# Patient Record
Sex: Female | Born: 1975 | Race: White | Hispanic: No | Marital: Single | Smoking: Never smoker
Health system: Southern US, Community
[De-identification: ages and names within clinical notes are randomized; demographics above are authoritative.]

## PROBLEM LIST (undated history)

## (undated) ENCOUNTER — Inpatient Hospital Stay (HOSPITAL_COMMUNITY): Payer: Self-pay

## (undated) DIAGNOSIS — J40 Bronchitis, not specified as acute or chronic: Secondary | ICD-10-CM

## (undated) DIAGNOSIS — O09299 Supervision of pregnancy with other poor reproductive or obstetric history, unspecified trimester: Secondary | ICD-10-CM

## (undated) DIAGNOSIS — Z3483 Encounter for supervision of other normal pregnancy, third trimester: Principal | ICD-10-CM

## (undated) DIAGNOSIS — O09529 Supervision of elderly multigravida, unspecified trimester: Secondary | ICD-10-CM

## (undated) DIAGNOSIS — K219 Gastro-esophageal reflux disease without esophagitis: Secondary | ICD-10-CM

## (undated) DIAGNOSIS — R51 Headache: Secondary | ICD-10-CM

## (undated) HISTORY — DX: Headache: R51

## (undated) HISTORY — DX: Gastro-esophageal reflux disease without esophagitis: K21.9

## (undated) HISTORY — DX: Supervision of elderly multigravida, unspecified trimester: O09.529

## (undated) HISTORY — DX: Supervision of pregnancy with other poor reproductive or obstetric history, unspecified trimester: O09.299

---

## 1997-10-26 ENCOUNTER — Emergency Department (HOSPITAL_COMMUNITY): Admission: EM | Admit: 1997-10-26 | Discharge: 1997-10-26 | Payer: Self-pay

## 1997-10-30 ENCOUNTER — Emergency Department (HOSPITAL_COMMUNITY): Admission: EM | Admit: 1997-10-30 | Discharge: 1997-10-30 | Payer: Self-pay | Admitting: Emergency Medicine

## 2000-09-03 ENCOUNTER — Emergency Department (HOSPITAL_COMMUNITY): Admission: EM | Admit: 2000-09-03 | Discharge: 2000-09-03 | Payer: Self-pay | Admitting: Emergency Medicine

## 2000-09-03 ENCOUNTER — Encounter: Payer: Self-pay | Admitting: Emergency Medicine

## 2000-10-29 ENCOUNTER — Emergency Department (HOSPITAL_COMMUNITY): Admission: EM | Admit: 2000-10-29 | Discharge: 2000-10-29 | Payer: Self-pay | Admitting: *Deleted

## 2001-02-05 ENCOUNTER — Emergency Department (HOSPITAL_COMMUNITY): Admission: EM | Admit: 2001-02-05 | Discharge: 2001-02-05 | Payer: Self-pay | Admitting: Emergency Medicine

## 2001-03-03 ENCOUNTER — Other Ambulatory Visit: Admission: RE | Admit: 2001-03-03 | Discharge: 2001-03-03 | Payer: Self-pay | Admitting: Obstetrics and Gynecology

## 2001-04-25 ENCOUNTER — Encounter: Payer: Self-pay | Admitting: Obstetrics and Gynecology

## 2001-04-25 ENCOUNTER — Ambulatory Visit (HOSPITAL_COMMUNITY): Admission: RE | Admit: 2001-04-25 | Discharge: 2001-04-25 | Payer: Self-pay | Admitting: Obstetrics and Gynecology

## 2001-05-16 ENCOUNTER — Encounter: Payer: Self-pay | Admitting: Obstetrics and Gynecology

## 2001-05-16 ENCOUNTER — Ambulatory Visit (HOSPITAL_COMMUNITY): Admission: RE | Admit: 2001-05-16 | Discharge: 2001-05-16 | Payer: Self-pay | Admitting: Obstetrics and Gynecology

## 2001-08-26 ENCOUNTER — Inpatient Hospital Stay (HOSPITAL_COMMUNITY): Admission: AD | Admit: 2001-08-26 | Discharge: 2001-08-26 | Payer: Self-pay | Admitting: Obstetrics and Gynecology

## 2001-08-28 ENCOUNTER — Inpatient Hospital Stay (HOSPITAL_COMMUNITY): Admission: AD | Admit: 2001-08-28 | Discharge: 2001-08-30 | Payer: Self-pay | Admitting: Obstetrics and Gynecology

## 2002-03-02 HISTORY — PX: TIBIA FRACTURE SURGERY: SHX806

## 2002-05-24 ENCOUNTER — Other Ambulatory Visit: Admission: RE | Admit: 2002-05-24 | Discharge: 2002-05-24 | Payer: Self-pay | Admitting: Obstetrics and Gynecology

## 2002-06-06 ENCOUNTER — Inpatient Hospital Stay (HOSPITAL_COMMUNITY): Admission: AC | Admit: 2002-06-06 | Discharge: 2002-06-10 | Payer: Self-pay

## 2002-06-06 ENCOUNTER — Encounter: Payer: Self-pay | Admitting: Emergency Medicine

## 2002-06-07 ENCOUNTER — Encounter: Payer: Self-pay | Admitting: Orthopedic Surgery

## 2002-09-12 ENCOUNTER — Encounter: Admission: RE | Admit: 2002-09-12 | Discharge: 2002-11-16 | Payer: Self-pay | Admitting: Orthopedic Surgery

## 2003-06-13 ENCOUNTER — Other Ambulatory Visit: Admission: RE | Admit: 2003-06-13 | Discharge: 2003-06-13 | Payer: Self-pay | Admitting: Obstetrics and Gynecology

## 2003-07-03 ENCOUNTER — Encounter: Admission: RE | Admit: 2003-07-03 | Discharge: 2003-07-03 | Payer: Self-pay | Admitting: Orthopedic Surgery

## 2003-07-18 ENCOUNTER — Ambulatory Visit (HOSPITAL_COMMUNITY): Admission: RE | Admit: 2003-07-18 | Discharge: 2003-07-18 | Payer: Self-pay | Admitting: Orthopedic Surgery

## 2004-05-12 ENCOUNTER — Ambulatory Visit (HOSPITAL_COMMUNITY): Admission: RE | Admit: 2004-05-12 | Discharge: 2004-05-12 | Payer: Self-pay | Admitting: Family Medicine

## 2004-05-28 ENCOUNTER — Emergency Department (HOSPITAL_COMMUNITY): Admission: EM | Admit: 2004-05-28 | Discharge: 2004-05-29 | Payer: Self-pay | Admitting: Emergency Medicine

## 2005-07-28 ENCOUNTER — Emergency Department (HOSPITAL_COMMUNITY): Admission: EM | Admit: 2005-07-28 | Discharge: 2005-07-28 | Payer: Self-pay | Admitting: Emergency Medicine

## 2006-08-18 ENCOUNTER — Emergency Department: Payer: Self-pay | Admitting: Emergency Medicine

## 2007-02-02 ENCOUNTER — Emergency Department (HOSPITAL_COMMUNITY): Admission: EM | Admit: 2007-02-02 | Discharge: 2007-02-02 | Payer: Self-pay | Admitting: Emergency Medicine

## 2007-02-03 ENCOUNTER — Emergency Department (HOSPITAL_COMMUNITY): Admission: EM | Admit: 2007-02-03 | Discharge: 2007-02-04 | Payer: Self-pay | Admitting: Emergency Medicine

## 2007-02-06 ENCOUNTER — Emergency Department (HOSPITAL_COMMUNITY): Admission: EM | Admit: 2007-02-06 | Discharge: 2007-02-07 | Payer: Self-pay | Admitting: Emergency Medicine

## 2007-06-18 ENCOUNTER — Emergency Department (HOSPITAL_COMMUNITY): Admission: EM | Admit: 2007-06-18 | Discharge: 2007-06-18 | Payer: Self-pay | Admitting: Emergency Medicine

## 2007-09-24 ENCOUNTER — Emergency Department (HOSPITAL_COMMUNITY): Admission: EM | Admit: 2007-09-24 | Discharge: 2007-09-24 | Payer: Self-pay | Admitting: Emergency Medicine

## 2010-07-18 NOTE — Op Note (Signed)
NAME:  Tracy Rojas, Tracy Rojas NO.:  [id number]   MEDICAL RECORD NO.:  [id number]                   PATIENT TYPE:  OIB   LOCATION:  NA                                   FACILITY:  [facility]   PHYSICIAN:  Myrtie Neither, M.D.                 DATE OF BIRTH:  November 11, 1975   DATE OF PROCEDURE:  07/18/2003  DATE OF DISCHARGE:                                 OPERATIVE REPORT   PREOPERATIVE DIAGNOSES:  1. Foreign body, left knee.  2. Nonunion, left tibia.  3. Painful hardware, left distal screw.   PREOPERATIVE DIAGNOSES:  1. Foreign body, left knee.  2. Nonunion, left tibia.  3. Painful hardware, left distal screw.   PROCEDURES:  1. Removal of foreign body, left knee.  2. Removal of screw, left tibia.  3. Injection of bone graft paste at the fracture site, left tibia.   SURGEON:  Myrtie Neither, M.D.   ANESTHESIA:  General.   DESCRIPTION OF PROCEDURE:  The patient was taken to the operating room after  the adequate level of premedication, given general anesthesia , intubated.  Left lower limb was prepped with Duraprep and draped  in a sterile manner.  Tourniquet used for hemostasis.  Mini C-arm used to visualize the nonunion  site as well as the foreign body.  Initially a small incision was made over  the old previous scar at the knee, going through the skin and subcutaneous  tissue down to the capsule.  The foreign body was identified and removed  which was a fragment of thick glass.  The area was irrigated and wound  closed with 3-0 nylon.   Next, incision was made over the distal tibia over the previously placed  distal screw, going through the skin.  Sharp and blunt dissection was made  down to the head of the screw, and then this screw was removed.  Irrigation  was then done, and wound closure with 3-0 nylon was done.   Next, used the mini C-arm for identification of the nonunion site.  The bone  graft past was mixed at the table, and with the use of both 16  and 18-gauge  needles, paste was able to be placed into and around the fracture site, both  anteriorly and posteriorly, both medially and laterally.  Injection sites  were irrigated and Band-Aids placed over the injections sites.   Compression dressing was applied, Cam walker applied.  The patient tolerated  the procedure quite well and went to the recovery room in stable and  satisfactory condition.  The patient is being discharged home with the use  of  crutches, partial weightbearing on the left side.  The patient is to use  __________ 10 one q.4h. p.r.n. for pain, Aciphex, and elevation at home and  to return to the office in one week.  The patient is being discharged in  stable and satisfactory condition.  Myrtie Neither, M.D.    AC/MEDQ  D:  07/18/2003  T:  07/18/2003  Job:  [id number]

## 2010-07-18 NOTE — Discharge Summary (Signed)
NAMEMITTIE, KNITTEL                        ACCOUNT NO.:  [id number]   MEDICAL RECORD NO.:  [id number]                   PATIENT TYPE:  NP   LOCATION:  9143                                 FACILITY:  [facility]   PHYSICIAN:  Huel Cote, M.D.              DATE OF BIRTH:  [birth year]   DATE OF ADMISSION:  08/28/2001  DATE OF DISCHARGE:  08/30/2001                                 DISCHARGE SUMMARY   DISCHARGE DIAGNOSES:  1. Preterm pregnancy at 36+ weeks, delivered.  2. Precipitous labor.  3. Status post normal spontaneous vaginal delivery.   DISCHARGE MEDICATIONS:  Motrin 600 mg p.o. q.6h. p.r.n.   DISCHARGE FOLLOW-UP:  The patient is to follow up in six weeks for routine  postpartum exam at which point she is interested in persuing a [place] IUD  placement.   HISTORY OF PRESENT ILLNESS:  The patient is a 35 year old G3 P1-1-0-2 who is  admitted at 7 and four-sevenths weeks with a complaint of contractions for  approximately two hours.  The patient ruptured membranes at approximately  5:30 a.m. and arrived to maternity admissions at approximately 7:30 a.m.  Upon arrival the patient was examined and found to be an anterior lip and  completely effaced, with delivery eminent.  Prenatal care had been  complicated by positive chlamydia which was treated with Zithromax and had a  negative test of cure.  She also had gestational thrombocytopenia which  remained stable throughout the pregnancy.   PRENATAL LABORATORY DATA:  B positive, antibody negative.  RPR nonreactive.  Rubella immune.  Hepatitis B surface antigen negative.  HIV negative.  GC  negative, chlamydia positive as stated earlier.  Triple screen declined.  One-hour Glucola 124.  Group B strep positive.   PAST OBSTETRICAL HISTORY:  In 1994 she had a normal spontaneous vaginal  delivery of a 34 week infant, 5 pounds 12 ounces.  In 1996 she had a normal  spontaneous vaginal delivery of a 40 week infant, 6 pounds 2 ounces.   PAST MEDICAL HISTORY:  None.   PAST SURGICAL HISTORY:  None.   PAST GYNECOLOGICAL HISTORY:  Chlamydia as stated.   ALLERGIES:  DIMETAPP.   HOSPITAL COURSE:  On admission she was afebrile with stable vital signs.  Fetal heart rate was reassuring except for variables with contractions.  The  physician on call, Dr. Lavina Hamman, was notified of the patient's arrival  to maternity admissions at 7:38 a.m. and advised of her exam with an  anterior lip.  Despite his arrival at the hospital within 15 minutes, the  patient reached complete dilation and delivered with a nurse midwife prior  to his arrival.  She was delivered of a vigorous female infant.  There was a  nuchal cord x1 which was reduced after delivery of the head.  Apgars were 8  and 9.  Weight was 5 pounds 7 ounces.  The patient was then admitted for  routine postpartum care.  She did very well.  Her platelet count was stable  at 112,000 postpartum and on postpartum day #2 she was afebrile  and tolerating her pain medications well.  She was therefore felt stable for  discharge and was discharged with medications and follow-up as previously  stated.                                                 Huel Cote, M.D.    KR/MEDQ  D:  10/06/2001  T:  10/11/2001  Job:  [id number]
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
## 2012-01-29 ENCOUNTER — Telehealth: Payer: Self-pay | Admitting: Hematology and Oncology

## 2012-01-29 NOTE — Telephone Encounter (Signed)
LVOM for pt to return call.  °

## 2012-02-01 ENCOUNTER — Telehealth: Payer: Self-pay | Admitting: Hematology and Oncology

## 2012-02-01 NOTE — Telephone Encounter (Signed)
S/W pt in re NP appt 12/12 @ 9:30 w/Dr. Dalene Carrow.  Referring Paulene Floor, NP Dx-Low PLTs Welcome packet mailed.

## 2012-02-02 ENCOUNTER — Telehealth: Payer: Self-pay | Admitting: Hematology and Oncology

## 2012-02-02 NOTE — Telephone Encounter (Signed)
C/D 02/02/12 for appt.02/11/12

## 2012-02-11 ENCOUNTER — Other Ambulatory Visit: Payer: Self-pay | Admitting: Lab

## 2012-02-11 ENCOUNTER — Ambulatory Visit: Payer: Self-pay

## 2012-02-11 ENCOUNTER — Ambulatory Visit: Payer: Self-pay | Admitting: Hematology and Oncology

## 2012-02-17 ENCOUNTER — Encounter: Payer: Self-pay | Admitting: Internal Medicine

## 2012-02-17 ENCOUNTER — Ambulatory Visit: Payer: Medicaid Other

## 2012-02-17 ENCOUNTER — Other Ambulatory Visit (HOSPITAL_BASED_OUTPATIENT_CLINIC_OR_DEPARTMENT_OTHER): Payer: Medicaid Other | Admitting: Lab

## 2012-02-17 ENCOUNTER — Ambulatory Visit (HOSPITAL_BASED_OUTPATIENT_CLINIC_OR_DEPARTMENT_OTHER): Payer: Medicaid Other | Admitting: Internal Medicine

## 2012-02-17 VITALS — BP 105/80 | HR 84 | Temp 98.4°F | Resp 22 | Ht 62.0 in | Wt 210.1 lb

## 2012-02-17 DIAGNOSIS — D696 Thrombocytopenia, unspecified: Secondary | ICD-10-CM

## 2012-02-17 HISTORY — DX: Thrombocytopenia, unspecified: D69.6

## 2012-02-17 LAB — CBC WITH DIFFERENTIAL/PLATELET
BASO%: 0.4 % (ref 0.0–2.0)
Basophils Absolute: 0 10*3/uL (ref 0.0–0.1)
EOS%: 1.3 % (ref 0.0–7.0)
Eosinophils Absolute: 0.1 10*3/uL (ref 0.0–0.5)
HCT: 41.5 % (ref 34.8–46.6)
HGB: 14.4 g/dL (ref 11.6–15.9)
LYMPH%: 31.6 % (ref 14.0–49.7)
MCH: 29.8 pg (ref 25.1–34.0)
MCHC: 34.8 g/dL (ref 31.5–36.0)
MCV: 85.8 fL (ref 79.5–101.0)
MONO#: 0.5 10*3/uL (ref 0.1–0.9)
MONO%: 6.7 % (ref 0.0–14.0)
NEUT#: 4.8 10*3/uL (ref 1.5–6.5)
NEUT%: 60 % (ref 38.4–76.8)
Platelets: 125 10*3/uL — ABNORMAL LOW (ref 145–400)
RBC: 4.84 10*6/uL (ref 3.70–5.45)
RDW: 13.7 % (ref 11.2–14.5)
WBC: 7.9 10*3/uL (ref 3.9–10.3)
lymph#: 2.5 10*3/uL (ref 0.9–3.3)

## 2012-02-17 LAB — COMPREHENSIVE METABOLIC PANEL (CC13)
ALT: 17 U/L (ref 0–55)
AST: 15 U/L (ref 5–34)
Albumin: 3.5 g/dL (ref 3.5–5.0)
Alkaline Phosphatase: 82 U/L (ref 40–150)
BUN: 9 mg/dL (ref 7.0–26.0)
CO2: 26 mEq/L (ref 22–29)
Calcium: 9 mg/dL (ref 8.4–10.4)
Chloride: 105 mEq/L (ref 98–107)
Creatinine: 0.9 mg/dL (ref 0.6–1.1)
Glucose: 107 mg/dl — ABNORMAL HIGH (ref 70–99)
Potassium: 4.2 mEq/L (ref 3.5–5.1)
Sodium: 144 mEq/L (ref 136–145)
Total Bilirubin: 0.47 mg/dL (ref 0.20–1.20)
Total Protein: 6.7 g/dL (ref 6.4–8.3)

## 2012-02-17 LAB — LACTATE DEHYDROGENASE (CC13): LDH: 172 U/L (ref 125–245)

## 2012-02-17 LAB — TECHNOLOGIST REVIEW

## 2012-02-17 NOTE — Patient Instructions (Signed)
You have mild low platelets count. Continue on observation for now. avoid NSAIDs.

## 2012-02-17 NOTE — Progress Notes (Signed)
[facility] Telephone:([phone]   Fax:([phone]  CONSULT NOTE  REFERRING PHYSICIAN: Paulene Floor, NP.  REASON FOR CONSULTATION: 36 years old white female with low platelets.  HPI Tracy Rojas is a 36 y.o. female was no significant past medical history except for left hip ORIF. The patient was seen by her family physician at the [facility] for routine evaluation and consideration of oral contraceptive pills. Routine CBC was performed on 12/24/2011 and it showed low platelets count of 133,000. Repeat CBC on 01/26/2012 showed platelets count of 124,000. The patient was referred to me today for further evaluation and recommendation regarding her condition. The patient does not have any bleeding issues, bruises or ecchymosis. She has been on treatment with proton pump inhibitors for one month but this was discontinued recently. She takes a lot of ibuprofen and Aleve for stress headache. She denied using any other over-the-counter medications. The patient denied having any significant weight loss or night sweats. She has no chest pain, shortness breath, cough or hemoptysis.  Past medical history: Significant for ORIF of the left hip. The patient denied having any history of hypertension, diabetes mellitus, coronary artery disease or stroke.  Family history: Father had throat cancer and mother had GERD and pulmonary embolism.  Social History: The patient is single and has 3 children. She is currently unemployed. She was to work as a Financial controller. She has no history of smoking, alcohol or drug abuse.   No Known Allergies  Current Outpatient Prescriptions  Medication Sig Dispense Refill  . UNABLE TO FIND Take by mouth daily. Medication for reflux, pt unsure of name        Review of Systems  A comprehensive review of systems was negative.  Physical Exam  NWG:NFAOZ, healthy, no distress, well nourished and well developed SKIN:  skin color, texture, turgor are normal HEAD: Normocephalic, No masses, lesions, tenderness or abnormalities EYES: normal, PERRLA EARS: External ears normal OROPHARYNX:no exudate and no erythema  NECK: supple, no adenopathy LYMPH:  no palpable lymphadenopathy BREAST:not examined LUNGS: clear to auscultation  HEART: regular rate & rhythm, no murmurs and no gallops ABDOMEN:abdomen soft, non-tender, normal bowel sounds and no masses or organomegaly BACK: Back symmetric, no curvature. EXTREMITIES:no joint deformities, effusion, or inflammation, no edema, no skin discoloration, no clubbing, no cyanosis  NEURO: alert & oriented x 3 with fluent speech, no focal motor/sensory deficits  PERFORMANCE STATUS: ECOG 0  LABORATORY DATA: No results found for this basename: WBC, HGB, HCT, MCV, PLT      Chemistry   No results found for this basename: NA, K, CL, CO2, BUN, CREATININE, GLU   No results found for this basename: CALCIUM, ALKPHOS, AST, ALT, BILITOT       RADIOGRAPHIC STUDIES: No results found.  ASSESSMENT: This is a very pleasant 36 years old white female with mild thrombocytopenia most likely drug-induced secondary to treatment with NSAIDs. 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 She is currently asymptomatic with no bleeding, bruises or ecchymosis.  PLAN: I have a lengthy discussion with the patient today about her condition. I advised her to discontinue taking any over the counter NSAIDs. The patient will continue on observation for now with routine followup visit with her primary care physician. I don't see a need for any further intervention at this point. I would be happy to see the patient in her platelets count less than 50,000 or if she has any significant bleeding, bruises or  ecchymosis. The patient agreed to the current plan. All questions were answered. The patient knows to call the clinic with any problems, questions or concerns. We can certainly see the patient much sooner if  necessary.  Thank you so much for allowing me to participate in the care of [name]. I will continue to follow up the patient with you and assist in her care.  I spent 25 minutes counseling the patient face to face. The total time spent in the appointment was 5 minutes.   Tracy Rojas K. 02/17/2012, 10:14 AM

## 2012-02-17 NOTE — Progress Notes (Signed)
Checked in new patient. No financial issues. °

## 2012-04-02 DIAGNOSIS — J40 Bronchitis, not specified as acute or chronic: Secondary | ICD-10-CM

## 2012-04-02 HISTORY — DX: Bronchitis, not specified as acute or chronic: J40

## 2012-07-10 ENCOUNTER — Emergency Department (HOSPITAL_COMMUNITY): Payer: Medicaid Other

## 2012-07-10 ENCOUNTER — Encounter (HOSPITAL_COMMUNITY): Payer: Self-pay | Admitting: Emergency Medicine

## 2012-07-10 ENCOUNTER — Emergency Department (HOSPITAL_COMMUNITY)
Admission: EM | Admit: 2012-07-10 | Discharge: 2012-07-11 | Disposition: A | Discharge status: Home / Self Care | Payer: Medicaid Other | Attending: Emergency Medicine | Admitting: Emergency Medicine

## 2012-07-10 DIAGNOSIS — J209 Acute bronchitis, unspecified: Secondary | ICD-10-CM | POA: Insufficient documentation

## 2012-07-10 DIAGNOSIS — J40 Bronchitis, not specified as acute or chronic: Secondary | ICD-10-CM

## 2012-07-10 MED ORDER — AZITHROMYCIN 250 MG PO TABS
500.0000 mg | ORAL_TABLET | Freq: Once | ORAL | Status: AC
  Administered 2012-07-10: 500 mg via ORAL
  Filled 2012-07-10: qty 2

## 2012-07-10 MED ORDER — GUAIFENESIN-CODEINE 100-10 MG/5ML PO SOLN
10.0000 mL | Freq: Once | ORAL | Status: AC
  Administered 2012-07-10: 10 mL via ORAL
  Filled 2012-07-10 (×2): qty 5

## 2012-07-10 MED ORDER — AZITHROMYCIN 250 MG PO TABS: 250.0000 mg | ORAL_TABLET | Freq: Every day | ORAL | Status: DC

## 2012-07-10 MED ORDER — GUAIFENESIN-CODEINE 100-10 MG/5ML PO SYRP: ORAL_SOLUTION | ORAL | Status: DC
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
## 2012-07-10 NOTE — ED Notes (Signed)
Onset of cough more than one month ago - thought was allergies - continues to get worse and more frequent.  Concerned she may be getting bronchitis

## 2012-08-12 ENCOUNTER — Ambulatory Visit: Payer: Self-pay | Admitting: General Practice

## 2012-08-12 ENCOUNTER — Telehealth: Payer: Self-pay | Admitting: Nurse Practitioner

## 2012-08-12 NOTE — Telephone Encounter (Signed)
appt given  

## 2012-09-06 ENCOUNTER — Encounter (HOSPITAL_COMMUNITY): Payer: Self-pay

## 2012-09-06 ENCOUNTER — Emergency Department (HOSPITAL_COMMUNITY)
Admission: EM | Admit: 2012-09-06 | Discharge: 2012-09-07 | Disposition: A | Discharge status: Home / Self Care | Payer: Medicaid Other | Attending: Emergency Medicine | Admitting: Emergency Medicine

## 2012-09-06 DIAGNOSIS — R05 Cough: Secondary | ICD-10-CM | POA: Insufficient documentation

## 2012-09-06 DIAGNOSIS — Z79899 Other long term (current) drug therapy: Secondary | ICD-10-CM | POA: Insufficient documentation

## 2012-09-06 DIAGNOSIS — R51 Headache: Secondary | ICD-10-CM | POA: Insufficient documentation

## 2012-09-06 DIAGNOSIS — R062 Wheezing: Secondary | ICD-10-CM | POA: Insufficient documentation

## 2012-09-06 DIAGNOSIS — R059 Cough, unspecified: Secondary | ICD-10-CM

## 2012-09-06 DIAGNOSIS — Z8709 Personal history of other diseases of the respiratory system: Secondary | ICD-10-CM | POA: Insufficient documentation

## 2012-09-06 HISTORY — DX: Bronchitis, not specified as acute or chronic: J40

## 2012-09-06 MED ORDER — BENZONATATE 100 MG PO CAPS
200.0000 mg | ORAL_CAPSULE | Freq: Once | ORAL | Status: AC
  Administered 2012-09-06: 200 mg via ORAL
  Filled 2012-09-06: qty 2

## 2012-09-06 MED ORDER — ALBUTEROL SULFATE HFA 108 (90 BASE) MCG/ACT IN AERS
2.0000 | INHALATION_SPRAY | Freq: Once | RESPIRATORY_TRACT | Status: AC
  Administered 2012-09-07: 2 via RESPIRATORY_TRACT
  Filled 2012-09-06: qty 6.7

## 2012-09-06 NOTE — ED Notes (Signed)
dx'd with bronchitis in feb or march. Has been coughing since then. Pt states nothing has changed in last week just hasn't been able to see her doctor for follow up
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
## 2012-09-07 MED ORDER — PREDNISONE 10 MG PO TABS: ORAL_TABLET | ORAL | Status: DC

## 2012-09-07 MED ORDER — BENZONATATE 100 MG PO CAPS: 200.0000 mg | ORAL_CAPSULE | Freq: Three times a day (TID) | ORAL | Status: DC | PRN

## 2012-09-07 NOTE — ED Provider Notes (Signed)
Medical screening examination/treatment/procedure(s) were performed by non-physician practitioner and as supervising physician I was immediately available for consultation/collaboration. Devoria Albe, MD, Armando Gang   Ward Givens, MD 09/07/12 2300

## 2012-09-09 ENCOUNTER — Encounter: Payer: Self-pay | Admitting: Family Medicine

## 2012-09-09 ENCOUNTER — Ambulatory Visit (INDEPENDENT_AMBULATORY_CARE_PROVIDER_SITE_OTHER): Payer: Medicaid Other | Admitting: Family Medicine

## 2012-09-09 VITALS — BP 111/76 | HR 71 | Temp 98.1°F | Ht 63.0 in | Wt 212.4 lb

## 2012-09-09 DIAGNOSIS — R51 Headache: Secondary | ICD-10-CM

## 2012-09-09 DIAGNOSIS — R400 Somnolence: Secondary | ICD-10-CM

## 2012-09-09 DIAGNOSIS — G4710 Hypersomnia, unspecified: Secondary | ICD-10-CM

## 2012-09-09 DIAGNOSIS — R209 Unspecified disturbances of skin sensation: Secondary | ICD-10-CM

## 2012-09-09 DIAGNOSIS — R202 Paresthesia of skin: Secondary | ICD-10-CM

## 2012-09-09 LAB — COMPREHENSIVE METABOLIC PANEL
ALT: 27 U/L (ref 0–35)
AST: 22 U/L (ref 0–37)
Albumin: 4.2 g/dL (ref 3.5–5.2)
Alkaline Phosphatase: 88 U/L (ref 39–117)
BUN: 7 mg/dL (ref 6–23)
CO2: 28 mEq/L (ref 19–32)
Calcium: 9.3 mg/dL (ref 8.4–10.5)
Chloride: 103 mEq/L (ref 96–112)
Creat: 0.96 mg/dL (ref 0.50–1.10)
Glucose, Bld: 94 mg/dL (ref 70–99)
Potassium: 4.6 mEq/L (ref 3.5–5.3)
Sodium: 137 mEq/L (ref 135–145)
Total Bilirubin: 0.5 mg/dL (ref 0.3–1.2)
Total Protein: 6.8 g/dL (ref 6.0–8.3)

## 2012-09-09 LAB — POCT CBC
Granulocyte percent: 58.4 %G (ref 37–80)
HCT, POC: 43.2 % (ref 37.7–47.9)
Hemoglobin: 15 g/dL (ref 12.2–16.2)
Lymph, poc: 3.2 (ref 0.6–3.4)
MCH, POC: 30.2 pg (ref 27–31.2)
MCHC: 34.8 g/dL (ref 31.8–35.4)
MCV: 86.7 fL (ref 80–97)
MPV: 9.8 fL (ref 0–99.8)
POC Granulocyte: 5.2 (ref 2–6.9)
POC LYMPH PERCENT: 36 %L (ref 10–50)
Platelet Count, POC: 132 10*3/uL — AB (ref 142–424)
RBC: 5 M/uL (ref 4.04–5.48)
RDW, POC: 14 %
WBC: 8.9 10*3/uL (ref 4.6–10.2)

## 2012-09-09 LAB — TSH: TSH: 1.498 u[IU]/mL (ref 0.350–4.500)

## 2012-09-09 LAB — VITAMIN B12: Vitamin B-12: 466 pg/mL (ref 211–911)

## 2012-09-09 MED ORDER — SUMATRIPTAN SUCCINATE 25 MG PO TABS: 25.0000 mg | ORAL_TABLET | ORAL | Status: DC | PRN

## 2012-09-09 NOTE — Progress Notes (Signed)
  Subjective:    Patient ID: Jeanette Caprice, female    DOB: Mar 19, 1975, 37 y.o.   MRN: [id number]  HPI Patient presents today with chief complaint of headaches. Patient states she's had headaches for several years in the past without a formal diagnosis. Patient has used Excedrin Migraine the past some improvement in symptoms. Patient states she's had a flare of her headache over the past one to 2 weeks. Headache is predominantly frontal with no radiation. Patient does have some associated photophobia nausea with this. Patient also reports that her son is been formally diagnosed with migraines in the past as well. Mild pain today.  Patient also reports bilateral hand paresthesias over the past 3-4 months. Patient states over this time frame she's also had some persistent daytime somnolence. Mild reported snoring at home. No recent trauma. No prior history of an anemia or vitamin deficiency.   Review of Systems  All other systems reviewed and are negative.       Objective:   Physical Exam  Constitutional:  Obese, NAD  HENT:  Head: Normocephalic and atraumatic.  Eyes: Conjunctivae are normal. Pupils are equal, round, and reactive to light.  Mild photophobia on funduscopic exam  Neck: Normal range of motion. Neck supple.  Cardiovascular: Normal rate, regular rhythm and normal heart sounds.   Pulmonary/Chest: Effort normal and breath sounds normal.  Abdominal: Soft.  Musculoskeletal: Normal range of motion.  Neurological: She is alert. No cranial nerve deficit. Coordination normal.  Skin: Skin is warm.          Assessment & Plan:  Headache(784.0) - Plan: SUMAtriptan (IMITREX) 25 MG tablet  Daytime somnolence - Plan: Split night study  Paresthesias - Plan: POCT CBC, Comprehensive metabolic panel, TSH, Vitamin B12   Will start patient on trial of Imitrex for headaches. This is fairly diagnostic. Also on the differential diagnosis is pseudotumor cerebri as patient  is obese and female however this is lower on the differential. Consider imaging her symptoms persist despite treatment.  We'll set up patient for a sleep study as daytime somnolence may be secondary to sleep apnea.  Broad differential for paresthesias including vitamin deficiency, hypothyroidism, migraine. We'll check baseline labs including CBC, CMP, vitamin B12, TSH. Followup pending blood work.   `

## 2012-09-13 ENCOUNTER — Other Ambulatory Visit: Payer: Self-pay | Admitting: *Deleted

## 2012-09-13 DIAGNOSIS — R400 Somnolence: Secondary | ICD-10-CM

## 2012-09-20 ENCOUNTER — Ambulatory Visit: Payer: Medicaid Other | Attending: Family Medicine | Admitting: Sleep Medicine

## 2012-09-20 ENCOUNTER — Ambulatory Visit (INDEPENDENT_AMBULATORY_CARE_PROVIDER_SITE_OTHER): Payer: Medicaid Other | Admitting: Family Medicine

## 2012-09-20 ENCOUNTER — Encounter: Payer: Self-pay | Admitting: Family Medicine

## 2012-09-20 VITALS — BP 112/76 | HR 78 | Temp 97.6°F | Ht 63.0 in | Wt 215.0 lb

## 2012-09-20 DIAGNOSIS — G4710 Hypersomnia, unspecified: Secondary | ICD-10-CM | POA: Insufficient documentation

## 2012-09-20 DIAGNOSIS — R51 Headache: Secondary | ICD-10-CM

## 2012-09-20 DIAGNOSIS — G4730 Sleep apnea, unspecified: Secondary | ICD-10-CM | POA: Insufficient documentation

## 2012-09-20 DIAGNOSIS — Z6835 Body mass index (BMI) 35.0-35.9, adult: Secondary | ICD-10-CM | POA: Insufficient documentation

## 2012-09-20 DIAGNOSIS — G8929 Other chronic pain: Secondary | ICD-10-CM

## 2012-09-20 MED ORDER — PROMETHAZINE HCL 25 MG PO TABS: 25.0000 mg | ORAL_TABLET | Freq: Four times a day (QID) | ORAL | Status: DC | PRN

## 2012-09-20 NOTE — Progress Notes (Signed)
  Subjective:    Patient ID: Tracy Rojas, female    DOB: 10-21-1975, 37 y.o.   MRN: [id number]  HPI  Pt here for follow up of headaches.  Pt was recently placed on trial of imitrex for headaches.  Has longstanding hx/o migrainous headaches.  Pt states that imitrex seemed to worsen headaches as well as cause nausea and dizziness.  No fevers or chills.  Still with some paresthesias in the L hand  Had blood work for paresthesias that was normal.  No hemiparesis or confusion.     Review of Systems  All other systems reviewed and are negative.       Objective:   Physical Exam  Constitutional: She is oriented to person, place, and time.  Obese   HENT:  Head: Normocephalic and atraumatic.  Eyes: Conjunctivae are normal. Pupils are equal, round, and reactive to light.  Neck: Normal range of motion.  Cardiovascular: Normal rate and regular rhythm.   Pulmonary/Chest: Effort normal.  Abdominal: Soft.  Neurological: She is alert and oriented to person, place, and time. No cranial nerve deficit.  Skin: Skin is warm.          Assessment & Plan:  Chronic headaches - Plan: Ambulatory referral to Neurology, promethazine (PHENERGAN) 25 MG tablet   Will formally refer to neuro for further evaluation.  No neuro red flags. On exam.  Rx phenergan for nausea.  Hold off on using imitrex pending follow up with neuro.    Marland Kitchen

## 2012-09-23 ENCOUNTER — Ambulatory Visit: Payer: Medicaid Other | Admitting: Family Medicine

## 2012-09-24 NOTE — Procedures (Signed)
[facility] Atiana Levier A. Gerilyn Pilgrim, MD     [email or web address]        NAMEBIANNEY, [name]            ACCOUNT NO.:  [id number]  MEDICAL RECORD NO.:  [id number]          PATIENT TYPE:  OUT  LOCATION:  SLEEP LAB                     FACILITY:  [facility]  PHYSICIAN:  Itsel Opfer A. Gerilyn Pilgrim, M.D. DATE OF BIRTH:  April 17, 1975  DATE OF STUDY:  09/20/2012                           NOCTURNAL POLYSOMNOGRAM  REFERRING PHYSICIAN:  Doree Albee, MD  INDICATION:  A 37 year old, who presents with hypersomnia, headaches, and fatigue.  MEDICATIONS:  None.  EPWORTH SLEEPINESS SCALE:  3.  BMI 35.  ARCHITECTURAL SUMMARY:  The total recording time is 396 minutes.  Sleep efficiency 65%.  Sleep latency 26 minutes.  REM latency 142 minutes. Stage N1 6%, N2 70%, N3 8%, and REM sleep 15%.  RESPIRATORY SUMMARY:  Baseline oxygen saturation is 98, lowest saturation 87 during REM sleep.  Diagnostic AHI is 3.5 and RDI 3.7.  LIMB MOVEMENT SUMMARY:  PLM index 2.3.  ELECTROCARDIOGRAM SUMMARY:  Average heart rate is 72 with no significant dysrhythmias observed.  IMPRESSION:  Unremarkable nocturnal polysomnography.  Thanks for this referral.   Jidenna Figgs A. Gerilyn Pilgrim, M.D.    KAD/MEDQ  D:  09/24/2012 12:55:15  T:  09/24/2012 13:12:38  Job:  [id number]

## 2012-10-10 ENCOUNTER — Ambulatory Visit (INDEPENDENT_AMBULATORY_CARE_PROVIDER_SITE_OTHER): Payer: Medicaid Other | Admitting: Diagnostic Neuroimaging

## 2012-10-10 ENCOUNTER — Encounter: Payer: Self-pay | Admitting: Diagnostic Neuroimaging

## 2012-10-10 VITALS — BP 120/81 | HR 81 | Ht 63.0 in | Wt 219.0 lb

## 2012-10-10 DIAGNOSIS — R51 Headache: Secondary | ICD-10-CM

## 2012-10-10 MED ORDER — BUTALBITAL-APAP-CAFFEINE 50-325-40 MG PO TABS: 1.0000 | ORAL_TABLET | Freq: Four times a day (QID) | ORAL | Status: DC | PRN

## 2012-10-10 MED ORDER — TOPIRAMATE 50 MG PO TABS: 50.0000 mg | ORAL_TABLET | Freq: Two times a day (BID) | ORAL | Status: DC
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
## 2012-10-10 NOTE — Patient Instructions (Addendum)
Referral to Opthamology for dilated exam.  We will order an MRI of the brain.  Start Topamax 50 mg daily at bedtime.  After 2 weeks, start taking twice a day.  For acute headache, take Fioricet, 1 tablet every 6 hours as needed.  Follow up in 2 months

## 2012-10-26 DIAGNOSIS — R51 Headache: Secondary | ICD-10-CM

## 2012-10-29 ENCOUNTER — Other Ambulatory Visit: Payer: Self-pay | Admitting: Neurology

## 2012-10-29 DIAGNOSIS — R51 Headache: Secondary | ICD-10-CM

## 2012-12-08 ENCOUNTER — Ambulatory Visit: Payer: Medicaid Other | Admitting: Diagnostic Neuroimaging

## 2012-12-13 ENCOUNTER — Encounter: Payer: Self-pay | Admitting: Diagnostic Neuroimaging

## 2012-12-13 ENCOUNTER — Ambulatory Visit (INDEPENDENT_AMBULATORY_CARE_PROVIDER_SITE_OTHER): Payer: Medicaid Other | Admitting: Diagnostic Neuroimaging

## 2012-12-13 VITALS — BP 121/75 | HR 70 | Temp 97.7°F | Ht 63.0 in | Wt 205.0 lb

## 2012-12-13 DIAGNOSIS — G44209 Tension-type headache, unspecified, not intractable: Secondary | ICD-10-CM

## 2012-12-13 DIAGNOSIS — G43009 Migraine without aura, not intractable, without status migrainosus: Secondary | ICD-10-CM

## 2012-12-13 HISTORY — DX: Tension-type headache, unspecified, not intractable: G44.209

## 2012-12-13 HISTORY — DX: Migraine without aura, not intractable, without status migrainosus: G43.009

## 2012-12-13 MED ORDER — TOPIRAMATE 50 MG PO TABS: 100.0000 mg | ORAL_TABLET | Freq: Two times a day (BID) | ORAL | Status: DC

## 2012-12-13 MED ORDER — RIZATRIPTAN BENZOATE 10 MG PO TBDP: 10.0000 mg | ORAL_TABLET | ORAL | Status: DC | PRN

## 2012-12-13 NOTE — Patient Instructions (Addendum)
Increase topiramate to 100mg  twice a day.  Try rizatriptan for breakthrough headaches.
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
## 2013-03-16 ENCOUNTER — Telehealth: Payer: Self-pay | Admitting: Nurse Practitioner

## 2013-03-16 ENCOUNTER — Ambulatory Visit (INDEPENDENT_AMBULATORY_CARE_PROVIDER_SITE_OTHER): Payer: Medicaid Other | Admitting: Nurse Practitioner

## 2013-03-16 ENCOUNTER — Encounter (INDEPENDENT_AMBULATORY_CARE_PROVIDER_SITE_OTHER): Payer: Self-pay

## 2013-03-16 NOTE — Telephone Encounter (Signed)
Tracy Rojas had to leave today before I saw her; I was running behind.  I called to apologize and left message that if she needs refills before her next visit to call back and I will fill them.-LL

## 2013-03-22 ENCOUNTER — Ambulatory Visit: Payer: Medicaid Other | Admitting: Nurse Practitioner

## 2013-05-18 ENCOUNTER — Encounter: Payer: Self-pay | Admitting: Nurse Practitioner

## 2013-05-18 ENCOUNTER — Encounter (INDEPENDENT_AMBULATORY_CARE_PROVIDER_SITE_OTHER): Payer: Self-pay

## 2013-05-18 ENCOUNTER — Ambulatory Visit (INDEPENDENT_AMBULATORY_CARE_PROVIDER_SITE_OTHER): Payer: Medicaid Other | Admitting: Nurse Practitioner

## 2013-05-18 VITALS — BP 116/71 | HR 70 | Ht 63.0 in | Wt 198.0 lb

## 2013-05-18 DIAGNOSIS — G43009 Migraine without aura, not intractable, without status migrainosus: Secondary | ICD-10-CM

## 2013-05-18 NOTE — Patient Instructions (Signed)
Continue Topirimate at current dose for Migraine prevention.  Try samples of Relpax and Cambia for acute headache,  Take at first sign of headache, then may repeat x 1 after 2 hours.  No more in a 24 hour period.
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
## 2013-06-20 NOTE — Progress Notes (Signed)
I reviewed note and agree with plan.   VIKRAM R. PENUMALLI, MD 06/20/2013, 5:54 PM Certified in Neurology, Neurophysiology and Neuroimaging  [facility] [address], [address] [place], [place] [address] [phone]  

## 2013-09-11 ENCOUNTER — Encounter: Payer: Self-pay | Admitting: Family

## 2013-09-11 ENCOUNTER — Ambulatory Visit (INDEPENDENT_AMBULATORY_CARE_PROVIDER_SITE_OTHER): Payer: Medicaid Other | Admitting: Family

## 2013-09-11 VITALS — BP 110/72 | HR 77 | Temp 98.3°F | Ht 61.5 in | Wt 182.4 lb

## 2013-09-11 DIAGNOSIS — Z30011 Encounter for initial prescription of contraceptive pills: Secondary | ICD-10-CM

## 2013-09-11 DIAGNOSIS — Z01419 Encounter for gynecological examination (general) (routine) without abnormal findings: Secondary | ICD-10-CM

## 2013-09-11 DIAGNOSIS — G43009 Migraine without aura, not intractable, without status migrainosus: Secondary | ICD-10-CM

## 2013-09-11 DIAGNOSIS — Z0000 Encounter for general adult medical examination without abnormal findings: Secondary | ICD-10-CM

## 2013-09-11 LAB — POCT URINALYSIS DIPSTICK
Bilirubin, UA: NEGATIVE
Glucose, UA: NEGATIVE
Ketones, UA: NEGATIVE
Nitrite, UA: NEGATIVE
Spec Grav, UA: >=1.03
Urobilinogen, UA: NEGATIVE
pH, UA: 5

## 2013-09-11 LAB — POCT UA - MICROSCOPIC ONLY
Casts, Ur, LPF, POC: NEGATIVE
Crystals, Ur, HPF, POC: NEGATIVE
Mucus, UA: NEGATIVE
Yeast, UA: NEGATIVE

## 2013-09-11 MED ORDER — NORGESTIM-ETH ESTRAD TRIPHASIC 0.18/0.215/0.25 MG-25 MCG PO TABS: 1.0000 | ORAL_TABLET | Freq: Every day | ORAL | Status: DC

## 2013-09-11 NOTE — Progress Notes (Signed)
   Subjective:    Patient ID: Tracy Rojas, female    DOB: 1975/10/06, 38 y.o.   MRN: [id number]  HPI Pt present today for an annual physical with pap. Pt currently only taking Topamax 50 mg BID for migraines. 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 Pt denies, SOB, palpations, or edema. Pt would also like to start birth control today.    Review of Systems  Constitutional: Negative.   HENT: Negative.   Eyes: Negative.   Respiratory: Negative.  Negative for shortness of breath.   Cardiovascular: Negative.  Negative for palpitations.  Gastrointestinal: Negative.   Endocrine: Negative.   Genitourinary: Negative.   Musculoskeletal: Negative.   Neurological: Negative.  Negative for headaches.  Hematological: Negative.   Psychiatric/Behavioral: Negative.   All other systems reviewed and are negative.      Objective:   Physical Exam  Vitals reviewed. Constitutional: She is oriented to person, place, and time. She appears well-developed and well-nourished. No distress.  HENT:  Head: Normocephalic and atraumatic.  Right Ear: External ear normal.  Left Ear: External ear normal.  Nose: Nose normal.  Mouth/Throat: Oropharynx is clear and moist.  Eyes: Pupils are equal, round, and reactive to light.  Neck: Normal range of motion. Neck supple. No thyromegaly present.  Cardiovascular: Normal rate, regular rhythm, normal heart sounds and intact distal pulses.   No murmur heard. Pulmonary/Chest: Effort normal and breath sounds normal. No respiratory distress. She has no wheezes.  Abdominal: Soft. Bowel sounds are normal. She exhibits no distension. There is no tenderness.  Genitourinary: Vagina normal and uterus normal. No vaginal discharge found.  Bimanual exam- no adnexal masses or tenderness, ovaries nonpalpable   Cervix parous and pink- No discharge   Musculoskeletal: Normal range of motion. She exhibits no edema and no tenderness.  Neurological: She is alert  and oriented to person, place, and time. She has normal reflexes. No cranial nerve deficit.  Skin: Skin is warm and dry.  Psychiatric: She has a normal mood and affect. Her behavior is normal. Judgment and thought content normal.      BP 110/72  Pulse 77  Temp(Src) 98.3 F (36.8 C) (Oral)  Ht 5' 1.5" (1.562 m)  Wt 182 lb 6.4 oz (82.736 kg)  BMI 33.91 kg/m2  LMP 09/08/2013     Assessment & Plan:  1. Annual physical exam - POCT urinalysis dipstick - POCT UA - Microscopic Only - POCT CBC; Future - BMP8+EGFR; Future - Lipid panel; Future - Vit D  25 hydroxy (rtn osteoporosis monitoring); Future  2. Migraine without aura and without status migrainosus, not intractable  3. Encounter for initial prescription of contraceptive pills -Discussed risks of DVT's and s/s to report -Discussed possibility of efficiency decreased of birth control with decrease of topamax dose greater than $RemoveBefo'200mg'[id number]$  - Norgestimate-Ethinyl Estradiol Triphasic 0.18/0.215/0.25 MG-25 MCG tab; Take 1 tablet by mouth daily.  Dispense: 1 Package; Refill: 11  4. Encounter for routine gynecological examination - Pap IG w/ reflex to HPV when ASC-U   Continue all meds Labs pending Health Maintenance reviewed Diet and exercise encouraged RTO 1 year  Evelina Dun, FNP

## 2013-09-11 NOTE — Patient Instructions (Signed)

## 2013-09-12 ENCOUNTER — Telehealth: Payer: Self-pay | Admitting: *Deleted

## 2013-09-12 ENCOUNTER — Other Ambulatory Visit: Payer: Self-pay | Admitting: Family

## 2013-09-12 LAB — PAP IG W/ RFLX HPV ASCU: PAP Smear Comment: 0

## 2013-09-12 MED ORDER — NITROFURANTOIN MONOHYD MACRO 100 MG PO CAPS: 100.0000 mg | ORAL_CAPSULE | Freq: Two times a day (BID) | ORAL | Status: DC

## 2013-09-12 NOTE — Telephone Encounter (Signed)
Pt aware of urine results and script at pharmacy.

## 2013-09-12 NOTE — Telephone Encounter (Signed)
Urine was positive for UTI. Script was sent to pharmacy.

## 2013-09-14 ENCOUNTER — Telehealth: Payer: Self-pay | Admitting: *Deleted

## 2013-09-14 NOTE — Telephone Encounter (Signed)
done

## 2013-10-03 ENCOUNTER — Telehealth: Payer: Self-pay | Admitting: Family

## 2013-10-03 NOTE — Telephone Encounter (Signed)
appt given for tomorrow with christy

## 2013-10-04 ENCOUNTER — Ambulatory Visit (INDEPENDENT_AMBULATORY_CARE_PROVIDER_SITE_OTHER): Payer: Medicaid Other | Admitting: Family

## 2013-10-04 ENCOUNTER — Encounter: Payer: Self-pay | Admitting: Family

## 2013-10-04 VITALS — BP 108/75 | HR 66 | Temp 98.0°F | Ht 61.5 in | Wt 178.6 lb

## 2013-10-04 DIAGNOSIS — Z3009 Encounter for other general counseling and advice on contraception: Secondary | ICD-10-CM

## 2013-10-04 MED ORDER — NORETHINDRONE 0.35 MG PO TABS: 1.0000 | ORAL_TABLET | Freq: Every day | ORAL | Status: DC

## 2013-10-04 NOTE — Progress Notes (Signed)
   Subjective:    Patient ID: Tracy Rojas, female    DOB: 1975-07-14, 38 y.o.   MRN: [id number]  HPI PT presents to the office to discussed s/s of birth control. Pt states she had been taking the birth control for a couple of weeks. However, Monday pt was extremely nausea and vomiting and had a migraine. Pt states it has been a long time since she had a migraine.    Review of Systems  Constitutional: Negative.   HENT: Negative.   Eyes: Negative.   Respiratory: Negative.  Negative for shortness of breath.   Cardiovascular: Negative.  Negative for palpitations.  Gastrointestinal: Negative.   Endocrine: Negative.   Genitourinary: Negative.   Musculoskeletal: Negative.   Neurological: Negative.  Negative for headaches.  Hematological: Negative.   Psychiatric/Behavioral: Negative.   All other systems reviewed and are negative.      Objective:   Physical Exam  Vitals reviewed. Constitutional: She is oriented to person, place, and time. She appears well-developed and well-nourished. No distress.  Cardiovascular: Normal rate, regular rhythm, normal heart sounds and intact distal pulses.   No murmur heard. Pulmonary/Chest: Effort normal and breath sounds normal. No respiratory distress. She has no wheezes.  Abdominal: Soft. Bowel sounds are normal. She exhibits no distension. There is no tenderness.  Musculoskeletal: Normal range of motion. She exhibits no edema and no tenderness.  Neurological: She is alert and oriented to person, place, and time. She has normal reflexes. No cranial nerve deficit.  Skin: Skin is warm and dry.  Psychiatric: She has a normal mood and affect. Her behavior is normal. Judgment and thought content normal.    BP 108/75  Pulse 66  Temp(Src) 98 F (36.7 C) (Oral)  Ht 5' 1.5" (1.562 m)  Wt 178 lb 9.6 oz (81.012 kg)  BMI 33.20 kg/m2  LMP 10/04/2013       Assessment & Plan:  1. Other general counseling and advice for contraceptive  management -Discussed importance of not smoking -Discussed risks of medications -Report any s/s of SOB, swelling, or blood clots Discussed importance of taking medication at same time everyda - norethindrone (ORTHO MICRONOR) 0.35 MG tablet; Take 1 tablet (0.35 mg total) by mouth daily.  Dispense: 1 Package; Refill: [place], FNP

## 2013-10-04 NOTE — Patient Instructions (Signed)
Contraception Choices  Birth control (contraception) is the use of any methods or devices to stop pregnancy from happening. Below are some methods to help avoid pregnancy.  HORMONAL BIRTH CONTROL  · A small tube put under the skin of the upper arm (implant). The tube can stay in place for 3 years. The implant must be taken out after 3 years.  · Shots given every 3 months.  · Pills taken every day.  · Patches that are changed once a week.  · A ring put into the vagina (vaginal ring). The ring is left in place for 3 weeks and removed for 1 week. Then, a new ring is put in the vagina.  · Emergency birth control pills taken after unprotected sex (intercourse).  BARRIER BIRTH CONTROL   · A thin covering worn on the penis (female condom) during sex.  · A soft, loose covering put into the vagina (female condom) before sex.  · A rubber bowl that sits over the cervix (diaphragm). The bowl must be made for you. The bowl is put into the vagina before sex. The bowl is left in place for 6 to 8 hours after sex.  · A small, soft cup that fits over the cervix (cervical cap). The cup must be made for you. The cup can be left in place for 48 hours after sex.  · A sponge that is put into the vagina before sex.  · A chemical that kills or stops sperm from getting into the cervix and uterus (spermicide). The chemical may be a cream, jelly, foam, or pill.  INTRAUTERINE (IUD) BIRTH CONTROL   · IUD birth control is a small, T-shaped piece of plastic. The plastic is put inside the uterus. There are 2 types of IUD:  ¨ Copper IUD. The IUD is covered in copper wire. The copper makes a fluid that kills sperm. It can stay in place for 10 years.  ¨ Hormone IUD. The hormone stops pregnancy from happening. It can stay in place for 5 years.  PERMANENT METHODS  · When the woman has her fallopian tubes sealed, tied, or blocked during surgery. This stops the egg from traveling to the uterus.  · The doctor places a small coil or insert into each fallopian  tube. This causes scar tissue to form and blocks the fallopian tubes.  · When the female has the tubes that carry sperm tied off (vasectomy).  NATURAL FAMILY PLANNING BIRTH CONTROL   · Natural family planning means not having sex or using barrier birth control on the days the woman could become pregnant.  · Use a calendar to keep track of the length of each period and know the days she can get pregnant.  · Avoid sex during ovulation.  · Use a thermometer to measure body temperature. Also watch for symptoms of ovulation.  · Time sex to be after the woman has ovulated.  Use condoms to help protect yourself against sexually transmitted infections (STIs). Do this no matter what type of birth control you use. Talk to your doctor about which type of birth control is best for you.  Document Released: 12/14/2008 Document Revised: 02/21/2013 Document Reviewed: 09/07/2012  ExitCare® Patient Information ©2015 [employer]. This information is not intended to replace advice given to you by your health care provider. Make sure you discuss any questions you have with your health care provider.

## 2013-11-16 ENCOUNTER — Telehealth: Payer: Self-pay | Admitting: Family Medicine

## 2013-11-16 NOTE — Telephone Encounter (Signed)
Patient complains of menstrual cramps x 3 weeks. She had 2 "periods" last month. Also complains of bloating and breast soreness. One episode of spotting after intercourse a few nights ago. Denies current vaginal discharge but did have a white vaginal discharge 1 week ago that lasted 2 days. She did not have any vaginal itching or odor. Neg home pregnancy test 2 weeks ago.  She is on OCPs but does miss taking them on time by hours or days sometimes.  Appt scheduled for tomorrow with Evelina Dun, FNP. She will call back if symptoms change or worsen. Suggested home pregnancy test today. If positive she will call back. Patient stated understanding and agreement to plan.

## 2013-11-17 ENCOUNTER — Ambulatory Visit (INDEPENDENT_AMBULATORY_CARE_PROVIDER_SITE_OTHER): Payer: Medicaid Other | Admitting: Family

## 2013-11-17 ENCOUNTER — Encounter: Payer: Self-pay | Admitting: Family

## 2013-11-17 VITALS — BP 102/67 | HR 74 | Temp 97.9°F | Ht 61.5 in | Wt 174.0 lb

## 2013-11-17 DIAGNOSIS — Z3009 Encounter for other general counseling and advice on contraception: Secondary | ICD-10-CM

## 2013-11-17 NOTE — Patient Instructions (Signed)
Contraception Choices  Birth control (contraception) is the use of any methods or devices to stop pregnancy from happening. Below are some methods to help avoid pregnancy.  HORMONAL BIRTH CONTROL  · A small tube put under the skin of the upper arm (implant). The tube can stay in place for 3 years. The implant must be taken out after 3 years.  · Shots given every 3 months.  · Pills taken every day.  · Patches that are changed once a week.  · A ring put into the vagina (vaginal ring). The ring is left in place for 3 weeks and removed for 1 week. Then, a new ring is put in the vagina.  · Emergency birth control pills taken after unprotected sex (intercourse).  BARRIER BIRTH CONTROL   · A thin covering worn on the penis (female condom) during sex.  · A soft, loose covering put into the vagina (female condom) before sex.  · A rubber bowl that sits over the cervix (diaphragm). The bowl must be made for you. The bowl is put into the vagina before sex. The bowl is left in place for 6 to 8 hours after sex.  · A small, soft cup that fits over the cervix (cervical cap). The cup must be made for you. The cup can be left in place for 48 hours after sex.  · A sponge that is put into the vagina before sex.  · A chemical that kills or stops sperm from getting into the cervix and uterus (spermicide). The chemical may be a cream, jelly, foam, or pill.  INTRAUTERINE (IUD) BIRTH CONTROL   · IUD birth control is a small, T-shaped piece of plastic. The plastic is put inside the uterus. There are 2 types of IUD:  ¨ Copper IUD. The IUD is covered in copper wire. The copper makes a fluid that kills sperm. It can stay in place for 10 years.  ¨ Hormone IUD. The hormone stops pregnancy from happening. It can stay in place for 5 years.  PERMANENT METHODS  · When the woman has her fallopian tubes sealed, tied, or blocked during surgery. This stops the egg from traveling to the uterus.  · The doctor places a small coil or insert into each fallopian  tube. This causes scar tissue to form and blocks the fallopian tubes.  · When the female has the tubes that carry sperm tied off (vasectomy).  NATURAL FAMILY PLANNING BIRTH CONTROL   · Natural family planning means not having sex or using barrier birth control on the days the woman could become pregnant.  · Use a calendar to keep track of the length of each period and know the days she can get pregnant.  · Avoid sex during ovulation.  · Use a thermometer to measure body temperature. Also watch for symptoms of ovulation.  · Time sex to be after the woman has ovulated.  Use condoms to help protect yourself against sexually transmitted infections (STIs). Do this no matter what type of birth control you use. Talk to your doctor about which type of birth control is best for you.  Document Released: 12/14/2008 Document Revised: 02/21/2013 Document Reviewed: 09/07/2012  ExitCare® Patient Information ©2015 [employer]. This information is not intended to replace advice given to you by your health care provider. Make sure you discuss any questions you have with your health care provider.

## 2013-11-17 NOTE — Progress Notes (Signed)
   Subjective:    Patient ID: Tracy Rojas, female    DOB: 11/20/1975, 38 y.o.   MRN: [id number]  Abdominal Pain Pertinent negatives include no headaches.   Pt presents to the office for abdominal pain and two menstrual cycles last month. Pt reports for the last three weeks she is having "period cramps". Pt has thought this was "my body trying to get use the pill".   Pt had been on combination pill, but was having migraines. Pt was then just put on a progestin-only pill.    Review of Systems  Constitutional: Negative.   HENT: Negative.   Eyes: Negative.   Respiratory: Negative.  Negative for shortness of breath.   Cardiovascular: Negative.  Negative for palpitations.  Gastrointestinal: Positive for abdominal pain.  Endocrine: Negative.   Genitourinary: Negative.   Musculoskeletal: Negative.   Neurological: Negative.  Negative for headaches.  Hematological: Negative.   Psychiatric/Behavioral: Negative.   All other systems reviewed and are negative.      Objective:   Physical Exam  Vitals reviewed. Constitutional: She is oriented to person, place, and time. She appears well-developed and well-nourished. No distress.  Neck: Normal range of motion.  Cardiovascular: Normal rate, regular rhythm, normal heart sounds and intact distal pulses.   No murmur heard. Pulmonary/Chest: Effort normal and breath sounds normal. No respiratory distress. She has no wheezes.  Abdominal: Soft. Bowel sounds are normal. She exhibits no distension and no mass. There is no tenderness. There is no rebound and no guarding.  Musculoskeletal: Normal range of motion. She exhibits no edema and no tenderness.  Neurological: She is alert and oriented to person, place, and time. She has normal reflexes. No cranial nerve deficit.  Skin: Skin is warm and dry.  Psychiatric: She has a normal mood and affect. Her behavior is normal. Judgment and thought content normal.     BP 102/67  Pulse 74  Temp(Src)  97.9 F (36.6 C) (Oral)  Ht 5' 1.5" (1.562 m)  Wt 174 lb (78.926 kg)  BMI 32.35 kg/m2      Assessment & Plan:  1. Birth control counseling -Continue current birth control until appointment with gynecology -RTO prn - Ambulatory referral to Gynecology  Evelina Dun, FNP

## 2013-11-20 ENCOUNTER — Ambulatory Visit: Payer: Medicaid Other | Admitting: Nurse Practitioner

## 2013-12-15 ENCOUNTER — Encounter: Payer: Self-pay | Admitting: Nurse Practitioner

## 2013-12-15 ENCOUNTER — Ambulatory Visit (INDEPENDENT_AMBULATORY_CARE_PROVIDER_SITE_OTHER): Payer: Medicaid Other | Admitting: Nurse Practitioner

## 2013-12-15 VITALS — BP 107/71 | HR 62 | Temp 98.0°F | Ht 62.0 in | Wt 175.0 lb

## 2013-12-15 DIAGNOSIS — G43009 Migraine without aura, not intractable, without status migrainosus: Secondary | ICD-10-CM

## 2013-12-15 MED ORDER — TOPIRAMATE 100 MG PO TABS: 100.0000 mg | ORAL_TABLET | Freq: Two times a day (BID) | ORAL | Status: DC
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
## 2013-12-15 NOTE — Patient Instructions (Addendum)
Continue Topirimate at current dose.  Refills have been sent. The new Rx will be for 100 mg tablets, take 1 twice daily.  Follow up in 6-12 months, sooner as needed.

## 2013-12-15 NOTE — Progress Notes (Signed)
I reviewed note and agree with plan.   Penni Bombard, MD 69/62/9528, 4:13 PM Certified in Neurology, Neurophysiology and Neuroimaging  [facility] [address], [place], [place] [address] [phone]

## 2013-12-21 ENCOUNTER — Encounter: Payer: Self-pay | Admitting: *Deleted

## 2014-01-16 ENCOUNTER — Encounter: Payer: Self-pay | Admitting: Diagnostic Neuroimaging

## 2014-03-07 ENCOUNTER — Ambulatory Visit (INDEPENDENT_AMBULATORY_CARE_PROVIDER_SITE_OTHER): Payer: Medicaid Other | Admitting: Family Medicine

## 2014-03-07 ENCOUNTER — Encounter: Payer: Self-pay | Admitting: Family Medicine

## 2014-03-07 VITALS — BP 132/80 | HR 87 | Temp 97.5°F | Ht 62.0 in | Wt 177.0 lb

## 2014-03-07 DIAGNOSIS — N390 Urinary tract infection, site not specified: Secondary | ICD-10-CM

## 2014-03-07 DIAGNOSIS — R300 Dysuria: Secondary | ICD-10-CM

## 2014-03-07 LAB — POCT UA - MICROSCOPIC ONLY
Casts, Ur, LPF, POC: NEGATIVE
Crystals, Ur, HPF, POC: NEGATIVE
Mucus, UA: NEGATIVE
RBC, urine, microscopic: NEGATIVE
Yeast, UA: NEGATIVE

## 2014-03-07 LAB — POCT URINALYSIS DIPSTICK
Bilirubin, UA: NEGATIVE
Blood, UA: NEGATIVE
Glucose, UA: NEGATIVE
Ketones, UA: NEGATIVE
Nitrite, UA: NEGATIVE
Protein, UA: NEGATIVE
Spec Grav, UA: 1.015
Urobilinogen, UA: NEGATIVE
pH, UA: 6

## 2014-03-07 MED ORDER — CIPROFLOXACIN HCL 500 MG PO TABS: 500.0000 mg | ORAL_TABLET | Freq: Two times a day (BID) | ORAL | Status: DC

## 2014-03-07 NOTE — Progress Notes (Signed)
   Subjective:    Patient ID: Tracy Rojas, female    DOB: [birth year], 39 y.o.   MRN: [id number]  HPI Patient is here for c/o urinary sx's.  Review of Systems  Constitutional: Negative for fever.  HENT: Negative for ear pain.   Eyes: Negative for discharge.  Respiratory: Negative for cough.   Cardiovascular: Negative for chest pain.  Gastrointestinal: Negative for abdominal distention.  Endocrine: Negative for polyuria.  Genitourinary: Negative for difficulty urinating.  Musculoskeletal: Negative for gait problem and neck pain.  Skin: Negative for color change and rash.  Neurological: Negative for speech difficulty and headaches.  Psychiatric/Behavioral: Negative for agitation.       Objective:    BP 132/80 mmHg  Pulse 87  Temp(Src) 97.5 F (36.4 C) (Oral)  Ht 5\' 2"  (1.575 m)  Wt 177 lb (80.287 kg)  BMI 32.37 kg/m2 Physical Exam  Constitutional: She is oriented to person, place, and time. She appears well-developed and well-nourished.  HENT:  Head: Normocephalic and atraumatic.  Mouth/Throat: Oropharynx is clear and moist.  Eyes: Pupils are equal, round, and reactive to light.  Neck: Normal range of motion. Neck supple.  Cardiovascular: Normal rate and regular rhythm.   No murmur heard. Pulmonary/Chest: Effort normal and breath sounds normal.  Abdominal: Soft. Bowel sounds are normal. There is no tenderness.  Neurological: She is alert and oriented to person, place, and time.  Skin: Skin is warm and dry.  Psychiatric: She has a normal mood and affect.         Assessment & Plan:     ICD-9-CM ICD-10-CM   1. Dysuria 788.1 R30.0 POCT urinalysis dipstick     POCT UA - Microscopic Only     Urine culture     ciprofloxacin (CIPRO) 500 MG tablet  2. Urinary tract infection without hematuria, site unspecified 599.0 N39.0 Urine culture     ciprofloxacin (CIPRO) 500 MG tablet     No Follow-up on file.  Lysbeth Penner FNP

## 2014-03-09 LAB — URINE CULTURE

## 2014-05-16 ENCOUNTER — Encounter: Payer: Self-pay | Admitting: Family

## 2014-05-16 ENCOUNTER — Ambulatory Visit (INDEPENDENT_AMBULATORY_CARE_PROVIDER_SITE_OTHER): Payer: Medicaid Other | Admitting: Family

## 2014-05-16 VITALS — BP 129/84 | HR 74 | Temp 99.7°F | Ht 62.0 in | Wt 175.8 lb

## 2014-05-16 DIAGNOSIS — R112 Nausea with vomiting, unspecified: Secondary | ICD-10-CM | POA: Diagnosis not present

## 2014-05-16 DIAGNOSIS — G43009 Migraine without aura, not intractable, without status migrainosus: Secondary | ICD-10-CM

## 2014-05-16 MED ORDER — SUMATRIPTAN SUCCINATE 100 MG PO TABS: 100.0000 mg | ORAL_TABLET | ORAL | Status: DC | PRN

## 2014-05-16 MED ORDER — ONDANSETRON 4 MG PO TBDP: 4.0000 mg | ORAL_TABLET | Freq: Three times a day (TID) | ORAL | Status: DC | PRN

## 2014-05-16 MED ORDER — KETOROLAC TROMETHAMINE 60 MG/2ML IM SOLN
60.0000 mg | Freq: Once | INTRAMUSCULAR | Status: AC
  Administered 2014-05-16: 60 mg via INTRAMUSCULAR

## 2014-05-16 NOTE — Patient Instructions (Signed)

## 2014-05-16 NOTE — Progress Notes (Signed)
   Subjective:    Patient ID: Tracy Rojas, female    DOB: 08/16/1975, 39 y.o.   MRN: [id number]  Headache  This is a recurrent problem. The current episode started in the past 7 days (Monday). The problem occurs constantly. The problem has been unchanged. The pain is located in the frontal region. The pain does not radiate. The pain quality is similar to prior headaches. The quality of the pain is described as sharp. The pain is at a severity of 10/10. The pain is moderate. Associated symptoms include ear pain, a fever, nausea, phonophobia, photophobia and vomiting. Pertinent negatives include no eye pain, sinus pressure or sore throat. She has tried Excedrin for the symptoms. The treatment provided mild relief. Her past medical history is significant for migraine headaches.  Emesis  Associated symptoms include a fever and headaches.      Review of Systems  Constitutional: Positive for fever.  HENT: Positive for ear pain. Negative for sinus pressure and sore throat.   Eyes: Positive for photophobia. Negative for pain.  Respiratory: Negative.  Negative for shortness of breath.   Cardiovascular: Negative.  Negative for palpitations.  Gastrointestinal: Positive for nausea and vomiting.  Endocrine: Negative.   Genitourinary: Negative.   Musculoskeletal: Negative.   Neurological: Positive for headaches.  Hematological: Negative.   Psychiatric/Behavioral: Negative.   All other systems reviewed and are negative.      Objective:   Physical Exam  Constitutional: She is oriented to person, place, and time. She appears well-developed and well-nourished. No distress.  HENT:  Head: Normocephalic and atraumatic.  Right Ear: External ear normal.  Left Ear: External ear normal.  Nose: Nose normal.  Mouth/Throat: Oropharynx is clear and moist.  Eyes: Pupils are equal, round, and reactive to light.  Neck: Normal range of motion. Neck supple. No thyromegaly present.  Cardiovascular: Normal  rate, regular rhythm, normal heart sounds and intact distal pulses.   No murmur heard. Pulmonary/Chest: Effort normal and breath sounds normal. No respiratory distress. She has no wheezes.  Abdominal: Soft. Bowel sounds are normal. She exhibits no distension. There is no tenderness.  Musculoskeletal: Normal range of motion. She exhibits no edema or tenderness.  Neurological: She is alert and oriented to person, place, and time. She has normal reflexes. No cranial nerve deficit.  Skin: Skin is warm and dry.  Psychiatric: She has a normal mood and affect. Her behavior is normal. Judgment and thought content normal.  Vitals reviewed.     BP 129/84 mmHg  Pulse 74  Temp(Src) 99.7 F (37.6 C) (Oral)  Ht 5\' 2"  (1.575 m)  Wt 175 lb 12.8 oz (79.742 kg)  BMI 32.15 kg/m2  LMP 05/15/2014     Assessment & Plan:  1. Migraine without aura and without status migrainosus, not intractable -Rest -Dark room RTO prn - ketorolac (TORADOL) injection 60 mg; Inject 2 mLs (60 mg total) into the muscle once. - SUMAtriptan (IMITREX) 100 MG tablet; Take 1 tablet (100 mg total) by mouth every 2 (two) hours as needed for migraine. May repeat in 2 hours if headache persists or recurs.  Dispense: 20 tablet; Refill: 3  2. Non-intractable vomiting with nausea, vomiting of unspecified type - ondansetron (ZOFRAN ODT) 4 MG disintegrating tablet; Take 1 tablet (4 mg total) by mouth every 8 (eight) hours as needed for nausea or vomiting.  Dispense: 20 tablet; Refill: 0  Evelina Dun, FNP

## 2014-06-18 ENCOUNTER — Ambulatory Visit: Payer: Medicaid Other | Admitting: Nurse Practitioner

## 2014-06-18 ENCOUNTER — Ambulatory Visit: Payer: Medicaid Other | Admitting: Diagnostic Neuroimaging

## 2014-06-19 ENCOUNTER — Ambulatory Visit: Payer: Medicaid Other | Admitting: Diagnostic Neuroimaging

## 2014-07-31 ENCOUNTER — Ambulatory Visit: Payer: Medicaid Other | Admitting: Diagnostic Neuroimaging

## 2014-08-01 ENCOUNTER — Encounter: Payer: Self-pay | Admitting: Diagnostic Neuroimaging

## 2014-09-11 ENCOUNTER — Encounter (INDEPENDENT_AMBULATORY_CARE_PROVIDER_SITE_OTHER): Payer: Self-pay

## 2014-09-11 ENCOUNTER — Encounter: Payer: Self-pay | Admitting: Family

## 2014-09-11 ENCOUNTER — Ambulatory Visit (INDEPENDENT_AMBULATORY_CARE_PROVIDER_SITE_OTHER): Payer: 59 | Admitting: Family

## 2014-09-11 VITALS — BP 113/80 | HR 82 | Temp 97.7°F | Ht 62.0 in | Wt 173.2 lb

## 2014-09-11 DIAGNOSIS — Z0000 Encounter for general adult medical examination without abnormal findings: Secondary | ICD-10-CM

## 2014-09-11 DIAGNOSIS — G43009 Migraine without aura, not intractable, without status migrainosus: Secondary | ICD-10-CM | POA: Diagnosis not present

## 2014-09-11 DIAGNOSIS — Z01419 Encounter for gynecological examination (general) (routine) without abnormal findings: Secondary | ICD-10-CM

## 2014-09-11 DIAGNOSIS — Z23 Encounter for immunization: Secondary | ICD-10-CM | POA: Diagnosis not present

## 2014-09-11 LAB — POCT UA - MICROSCOPIC ONLY
Bacteria, U Microscopic: NEGATIVE
Casts, Ur, LPF, POC: NEGATIVE
Crystals, Ur, HPF, POC: NEGATIVE
Yeast, UA: NEGATIVE

## 2014-09-11 LAB — POCT CBC
Granulocyte percent: 59.3 %G (ref 37–80)
HCT, POC: 44.6 % (ref 37.7–47.9)
Hemoglobin: 14.3 g/dL (ref 12.2–16.2)
Lymph, poc: 3.4 (ref 0.6–3.4)
MCH, POC: 29 pg (ref 27–31.2)
MCHC: 32.1 g/dL (ref 31.8–35.4)
MCV: 90.3 fL (ref 80–97)
MPV: 9.7 fL (ref 0–99.8)
POC Granulocyte: 5.8 (ref 2–6.9)
POC LYMPH PERCENT: 34.7 %L (ref 10–50)
Platelet Count, POC: 127 10*3/uL — AB (ref 142–424)
RBC: 4.93 M/uL (ref 4.04–5.48)
RDW, POC: 13 %
WBC: 9.8 10*3/uL (ref 4.6–10.2)

## 2014-09-11 LAB — POCT URINALYSIS DIPSTICK
Bilirubin, UA: NEGATIVE
Blood, UA: NEGATIVE
Glucose, UA: NEGATIVE
Ketones, UA: NEGATIVE
Leukocytes, UA: NEGATIVE
Nitrite, UA: NEGATIVE
Spec Grav, UA: >=1.03
Urobilinogen, UA: NEGATIVE
pH, UA: 6

## 2014-09-11 MED ORDER — TOPIRAMATE 100 MG PO TABS: 100.0000 mg | ORAL_TABLET | Freq: Two times a day (BID) | ORAL | Status: DC

## 2014-09-11 NOTE — Progress Notes (Signed)
   Subjective:    Patient ID: Tracy Rojas, female    DOB: 02/05/1976, 39 y.o.   MRN: [id number]  Pt presents to the office today for CPE with pap.  Migraine  This is a chronic problem. The current episode started more than 1 year ago. The problem occurs intermittently. The problem has been resolved. The pain is located in the frontal region. The pain quality is similar to prior headaches. The quality of the pain is described as aching. The pain is moderate. Associated symptoms include phonophobia and photophobia. She has tried triptans and beta blockers for the symptoms. Her past medical history is significant for migraine headaches.      Review of Systems  Constitutional: Negative.   HENT: Negative.   Eyes: Positive for photophobia.  Respiratory: Negative.  Negative for shortness of breath.   Cardiovascular: Negative.  Negative for palpitations.  Gastrointestinal: Negative.   Endocrine: Negative.   Genitourinary: Negative.   Musculoskeletal: Negative.   Neurological: Negative.  Negative for headaches.  Hematological: Negative.   Psychiatric/Behavioral: Negative.   All other systems reviewed and are negative.      Objective:   Physical Exam  Constitutional: She is oriented to person, place, and time. She appears well-developed and well-nourished. No distress.  HENT:  Head: Normocephalic and atraumatic.  Right Ear: External ear normal.  Mouth/Throat: Oropharynx is clear and moist.  Eyes: Pupils are equal, round, and reactive to light.  Neck: Normal range of motion. Neck supple. No thyromegaly present.  Cardiovascular: Normal rate, regular rhythm, normal heart sounds and intact distal pulses.   No murmur heard. Pulmonary/Chest: Effort normal and breath sounds normal. No respiratory distress. She has no wheezes. Right breast exhibits no inverted nipple, no mass, no nipple discharge, no skin change and no tenderness. Left breast exhibits no inverted nipple, no mass, no nipple  discharge, no skin change and no tenderness. Breasts are symmetrical.  Abdominal: Soft. Bowel sounds are normal. She exhibits no distension. There is no tenderness.  Genitourinary: Vagina normal.  Bimanual exam- no adnexal masses or tenderness, ovaries nonpalpable   Cervix parous and eythemas- No discharge   Musculoskeletal: Normal range of motion. She exhibits no edema or tenderness.  Neurological: She is alert and oriented to person, place, and time. She has normal reflexes. No cranial nerve deficit.  Skin: Skin is warm and dry.  Psychiatric: She has a normal mood and affect. Her behavior is normal. Judgment and thought content normal.  Vitals reviewed.    BP 113/80 mmHg  Pulse 82  Temp(Src) 97.7 F (36.5 C) (Oral)  Ht _0  (1.575 m)  Wt 173 lb 3.2 oz (78.563 kg)  BMI 31.67 kg/m2      Assessment & Plan:  1. Encounter for routine gynecological examination - POCT urinalysis dipstick - POCT UA - Microscopic Only - Pap IG w/ reflex to HPV when ASC-U  2. Migraine without aura and without status migrainosus, not intractable - topiramate (TOPAMAX) 100 MG tablet; Take 1 tablet (100 mg total) by mouth 2 (two) times daily.  Dispense: 180 tablet; Refill: 3  3. Annual physical exam - POCT CBC - CMP14+EGFR - Lipid panel - Thyroid Panel With TSH - Vit D  25 hydroxy (rtn osteoporosis monitoring) - Pap IG w/ reflex to HPV when ASC-U   Continue all meds Labs pending Health Maintenance reviewed- TDAP given today Diet and exercise encouraged RTO 1 year- Pt wants IUD placed  Evelina Dun, FNP

## 2014-09-11 NOTE — Patient Instructions (Signed)

## 2014-09-11 NOTE — Addendum Note (Signed)
Addended by: Shelbie Ammons on: 09/11/2014 04:13 PM   Modules accepted: Orders

## 2014-09-13 LAB — PAP IG W/ RFLX HPV ASCU: PAP Smear Comment: 0

## 2014-09-13 LAB — CMP14+EGFR
ALT: 9 IU/L (ref 0–32)
AST: 16 IU/L (ref 0–40)
Albumin/Globulin Ratio: 1.7 (ref 1.1–2.5)
Albumin: 4.1 g/dL (ref 3.5–5.5)
Alkaline Phosphatase: 58 IU/L (ref 39–117)
BUN/Creatinine Ratio: 10 (ref 8–20)
BUN: 8 mg/dL (ref 6–20)
Bilirubin Total: <0.2 mg/dL (ref 0.0–1.2)
CO2: 20 mmol/L (ref 18–29)
Calcium: 9.1 mg/dL (ref 8.7–10.2)
Chloride: 102 mmol/L (ref 97–108)
Creatinine, Ser: 0.8 mg/dL (ref 0.57–1.00)
GFR calc Af Amer: 109 mL/min/{1.73_m2} (ref >59)
GFR calc non Af Amer: 94 mL/min/{1.73_m2} (ref >59)
Globulin, Total: 2.4 g/dL (ref 1.5–4.5)
Glucose: 94 mg/dL (ref 65–99)
Potassium: 4.5 mmol/L (ref 3.5–5.2)
Sodium: 142 mmol/L (ref 134–144)
Total Protein: 6.5 g/dL (ref 6.0–8.5)

## 2014-09-13 LAB — THYROID PANEL WITH TSH
Free Thyroxine Index: 2.1 (ref 1.2–4.9)
T3 Uptake Ratio: 27 % (ref 24–39)
T4, Total: 7.9 ug/dL (ref 4.5–12.0)
TSH: 1.33 u[IU]/mL (ref 0.450–4.500)

## 2014-09-13 LAB — LIPID PANEL
Chol/HDL Ratio: 4.1 ratio units (ref 0.0–4.4)
Cholesterol, Total: 159 mg/dL (ref 100–199)
HDL: 39 mg/dL — ABNORMAL LOW (ref >39)
LDL Calculated: 72 mg/dL (ref 0–99)
Triglycerides: 239 mg/dL — ABNORMAL HIGH (ref 0–149)
VLDL Cholesterol Cal: 48 mg/dL — ABNORMAL HIGH (ref 5–40)

## 2014-09-13 LAB — VITAMIN D 25 HYDROXY (VIT D DEFICIENCY, FRACTURES): Vit D, 25-Hydroxy: 18.8 ng/mL — ABNORMAL LOW (ref 30.0–100.0)

## 2014-09-14 ENCOUNTER — Other Ambulatory Visit: Payer: Self-pay | Admitting: Family

## 2014-09-14 ENCOUNTER — Other Ambulatory Visit: Payer: Medicaid Other | Admitting: Family

## 2014-09-14 DIAGNOSIS — E559 Vitamin D deficiency, unspecified: Secondary | ICD-10-CM

## 2014-09-14 MED ORDER — VITAMIN D (ERGOCALCIFEROL) 1.25 MG (50000 UNIT) PO CAPS: 50000.0000 [IU] | ORAL_CAPSULE | ORAL | Status: DC

## 2014-10-05 ENCOUNTER — Encounter: Payer: Self-pay | Admitting: Family Medicine

## 2014-10-05 ENCOUNTER — Ambulatory Visit (INDEPENDENT_AMBULATORY_CARE_PROVIDER_SITE_OTHER): Payer: 59 | Admitting: Family Medicine

## 2014-10-05 VITALS — BP 120/73 | HR 83 | Temp 98.7°F | Ht 62.0 in | Wt 171.4 lb

## 2014-10-05 DIAGNOSIS — N912 Amenorrhea, unspecified: Secondary | ICD-10-CM

## 2014-10-05 DIAGNOSIS — Z3490 Encounter for supervision of normal pregnancy, unspecified, unspecified trimester: Secondary | ICD-10-CM

## 2014-10-05 LAB — POCT URINE PREGNANCY: Preg Test, Ur: POSITIVE — AB

## 2014-10-05 MED ORDER — PRENATAL FORMULA 28-0.8 MG PO TABS: 1.0000 | ORAL_TABLET | Freq: Every day | ORAL | Status: DC

## 2014-10-05 MED ORDER — VITAMIN D 50 MCG (2000 UT) PO TABS: 2000.0000 [IU] | ORAL_TABLET | Freq: Every day | ORAL | Status: DC

## 2014-10-05 NOTE — Progress Notes (Signed)
Subjective:  Patient ID: Tracy Rojas, female    DOB: 07-23-1975  Age: 39 y.o. MRN: [id number]  CC: No chief complaint on file.   HPI ZANIYA MCAULAY presents for confirmation of pregnancy. She states that she had a very light period of few weeks ago. But her last full period started on June 17. She has been sexually active. She takes 200 mg daily Topamax along with birth control pills. She was due to have an IUD inserted in the next few days. Noted that she had a couple of days of spotting around July 17. She took 2 pregnancy tests this morning both of which were positive. She states she's had some nausea that has been mild to moderate.  History Myeasha has a past medical history of Bronchitis (04/2012); GERD (gastroesophageal reflux disease); and Headache(784.0).   She has past surgical history that includes Tibia fracture surgery (Left, 2004).   Her family history includes Cancer in her father; Hypertension in her father and mother; Migraines in her mother.She reports that she has never smoked. She has never used smokeless tobacco. She reports that she does not drink alcohol or use illicit drugs.  Outpatient Prescriptions Prior to Visit  Medication Sig Dispense Refill  . PRESCRIPTION MEDICATION Triphasic birth control pills    . topiramate (TOPAMAX) 100 MG tablet Take 1 tablet (100 mg total) by mouth 2 (two) times daily. 180 tablet 3  . ondansetron (ZOFRAN ODT) 4 MG disintegrating tablet Take 1 tablet (4 mg total) by mouth every 8 (eight) hours as needed for nausea or vomiting. (Patient not taking: Reported on 09/11/2014) 20 tablet 0  . SUMAtriptan (IMITREX) 100 MG tablet Take 1 tablet (100 mg total) by mouth every 2 (two) hours as needed for migraine. May repeat in 2 hours if headache persists or recurs. (Patient not taking: Reported on 09/11/2014) 20 tablet 3  . Vitamin D, Ergocalciferol, (DRISDOL) 50000 UNITS CAPS capsule Take 1 capsule (50,000 Units total) by mouth every 7  (seven) days. 12 capsule 3   No facility-administered medications prior to visit.    ROS Review of Systems  Constitutional: Negative for fever, chills, diaphoresis, appetite change and fatigue.  HENT: Negative for congestion, ear pain, hearing loss, postnasal drip, rhinorrhea, sore throat and trouble swallowing.   Respiratory: Negative for cough, chest tightness and shortness of breath.   Cardiovascular: Negative for chest pain and palpitations.  Gastrointestinal: Positive for nausea. Negative for abdominal pain.  Musculoskeletal: Negative for arthralgias.  Skin: Negative for rash.    Objective:  BP 120/73 mmHg  Pulse 83  Temp(Src) 98.7 F (37.1 C) (Oral)  Ht 5\' 2"  (1.575 m)  Wt 171 lb 6.4 oz (77.747 kg)  BMI 31.34 kg/m2  LMP 09/28/2014  BP Readings from Last 3 Encounters:  10/05/14 120/73  09/11/14 113/80  05/16/14 129/84    Wt Readings from Last 3 Encounters:  10/05/14 171 lb 6.4 oz (77.747 kg)  09/11/14 173 lb 3.2 oz (78.563 kg)  05/16/14 175 lb 12.8 oz (79.742 kg)     Physical Exam  Constitutional: She appears well-developed and well-nourished.  HENT:  Head: Normocephalic and atraumatic.  Eyes: Pupils are equal, round, and reactive to light.  Neck: No thyromegaly present.  Cardiovascular: Normal rate and regular rhythm.   No murmur heard. Pulmonary/Chest: Breath sounds normal.  Skin: Skin is warm and dry.    No results found for: HGBA1C  Lab Results  Component Value Date   WBC 9.8 09/11/2014   HGB  14.3 09/11/2014   HCT 44.6 09/11/2014   PLT 125* 02/17/2012   GLUCOSE 94 09/11/2014   CHOL 159 09/11/2014   TRIG 239* 09/11/2014   HDL 39* 09/11/2014   LDLCALC 72 09/11/2014   ALT 9 09/11/2014   AST 16 09/11/2014   NA 142 09/11/2014   K 4.5 09/11/2014   CL 102 09/11/2014   CREATININE 0.80 09/11/2014   BUN 8 09/11/2014   CO2 20 09/11/2014   TSH 1.330 09/11/2014    No results found.  Assessment & Plan:   Diagnoses and all orders for this  visit:  Amenorrhea Orders: -     POCT urine pregnancy -     Ambulatory referral to Obstetrics / Gynecology  Pregnancy Orders: -     Ambulatory referral to Obstetrics / Gynecology  Other orders -     Prenatal Vit-Fe Fumarate-FA (PRENATAL FORMULA) 28-0.8 MG TABS; Take 1 tablet by mouth daily. -     Cholecalciferol (VITAMIN D) 2000 UNITS tablet; Take 1 tablet (2,000 Units total) by mouth daily.   I have discontinued Ms. Rueth's ondansetron, SUMAtriptan, topiramate, and Vitamin D (Ergocalciferol). I am also having her start on PRENATAL FORMULA and Vitamin D. Additionally, I am having her maintain her PRESCRIPTION MEDICATION.  Meds ordered this encounter  Medications  . Prenatal Vit-Fe Fumarate-FA (PRENATAL FORMULA) 28-0.8 MG TABS    Sig: Take 1 tablet by mouth daily.    Dispense:  30 each    Refill:  11  . Cholecalciferol (VITAMIN D) 2000 UNITS tablet    Sig: Take 1 tablet (2,000 Units total) by mouth daily.    Dispense:  30 tablet    Refill:  11     Follow-up: Return if symptoms worsen or fail to improve, for And after delivery of the baby.Claretta Fraise, M.D.

## 2014-10-08 LAB — OB RESULTS CONSOLE GC/CHLAMYDIA
Chlamydia: NEGATIVE
Gonorrhea: NEGATIVE

## 2014-10-08 LAB — OB RESULTS CONSOLE RUBELLA ANTIBODY, IGM: Rubella: IMMUNE

## 2014-10-08 LAB — OB RESULTS CONSOLE ABO/RH: RH Type: POSITIVE

## 2014-10-08 LAB — OB RESULTS CONSOLE HIV ANTIBODY (ROUTINE TESTING): HIV: NONREACTIVE

## 2014-10-08 LAB — OB RESULTS CONSOLE ANTIBODY SCREEN: Antibody Screen: NEGATIVE

## 2014-10-08 LAB — OB RESULTS CONSOLE RPR: RPR: NONREACTIVE

## 2014-10-08 LAB — OB RESULTS CONSOLE HEPATITIS B SURFACE ANTIGEN: Hepatitis B Surface Ag: NEGATIVE

## 2014-12-26 ENCOUNTER — Ambulatory Visit: Payer: 59 | Admitting: Family

## 2014-12-26 ENCOUNTER — Encounter: Payer: Self-pay | Admitting: Family

## 2014-12-26 ENCOUNTER — Ambulatory Visit (INDEPENDENT_AMBULATORY_CARE_PROVIDER_SITE_OTHER): Payer: Medicaid Other | Admitting: Family

## 2014-12-26 VITALS — BP 132/82 | HR 99 | Temp 98.3°F | Ht 62.0 in | Wt 186.4 lb

## 2014-12-26 DIAGNOSIS — A499 Bacterial infection, unspecified: Secondary | ICD-10-CM

## 2014-12-26 DIAGNOSIS — N760 Acute vaginitis: Secondary | ICD-10-CM

## 2014-12-26 DIAGNOSIS — B373 Candidiasis of vulva and vagina: Secondary | ICD-10-CM | POA: Diagnosis not present

## 2014-12-26 DIAGNOSIS — B3731 Acute candidiasis of vulva and vagina: Secondary | ICD-10-CM

## 2014-12-26 DIAGNOSIS — B9689 Other specified bacterial agents as the cause of diseases classified elsewhere: Secondary | ICD-10-CM

## 2014-12-26 LAB — POCT WET PREP (WET MOUNT)

## 2014-12-26 MED ORDER — CLOTRIMAZOLE 2 % VA CREA: 1.0000 | TOPICAL_CREAM | Freq: Every day | VAGINAL | Status: DC

## 2014-12-26 MED ORDER — METRONIDAZOLE 500 MG PO TABS: 500.0000 mg | ORAL_TABLET | Freq: Two times a day (BID) | ORAL | Status: DC

## 2014-12-26 NOTE — Patient Instructions (Addendum)

## 2014-12-26 NOTE — Progress Notes (Signed)
   Subjective:    Patient ID: Tracy Rojas, female    DOB: 01-Dec-1975, 39 y.o.   MRN: [id number]  Pt presents to the office today for vaginitis. Pt is [redacted] weeks pregnant.  Vaginal Discharge The patient's primary symptoms include genital itching and vaginal discharge. The patient's pertinent negatives include no genital odor. This is a new problem. The current episode started in the past 7 days. The problem occurs constantly. The problem has been unchanged. The patient is experiencing no pain. Pertinent negatives include no discolored urine, dysuria, fever, headaches, hematuria, nausea, sore throat or urgency. The vaginal discharge was white and thick. She is sexually active.      Review of Systems  Constitutional: Negative.  Negative for fever.  HENT: Negative.  Negative for sore throat.   Eyes: Negative.   Respiratory: Negative.  Negative for shortness of breath.   Cardiovascular: Negative.  Negative for palpitations.  Gastrointestinal: Negative.  Negative for nausea.  Endocrine: Negative.   Genitourinary: Positive for vaginal discharge. Negative for dysuria, urgency and hematuria.  Musculoskeletal: Negative.   Neurological: Negative.  Negative for headaches.  Hematological: Negative.   Psychiatric/Behavioral: Negative.   All other systems reviewed and are negative.      Objective:   Physical Exam  Constitutional: She is oriented to person, place, and time. She appears well-developed and well-nourished. No distress.  HENT:  Head: Normocephalic and atraumatic.  Right Ear: External ear normal.  Left Ear: External ear normal.  Nose: Nose normal.  Mouth/Throat: Oropharynx is clear and moist.  Eyes: Pupils are equal, round, and reactive to light.  Neck: Normal range of motion. Neck supple. No thyromegaly present.  Cardiovascular: Normal rate, regular rhythm, normal heart sounds and intact distal pulses.   No murmur heard. Pulmonary/Chest: Effort normal and breath sounds  normal. No respiratory distress. She has no wheezes.  Abdominal: Soft. Bowel sounds are normal. She exhibits no distension. There is no tenderness.  Musculoskeletal: Normal range of motion. She exhibits no edema or tenderness.  Neurological: She is alert and oriented to person, place, and time. She has normal reflexes. No cranial nerve deficit.  Skin: Skin is warm and dry.  Psychiatric: She has a normal mood and affect. Her behavior is normal. Judgment and thought content normal.  Vitals reviewed.     BP 132/82 mmHg  Pulse 99  Temp(Src) 98.3 F (36.8 C) (Oral)  Ht 5\' 2"  (1.575 m)  Wt 186 lb 6.4 oz (84.55 kg)  BMI 34.08 kg/m2     Assessment & Plan:  1. Yeast vaginitis -Keep clean and dry -Cotton underwear RTO prn - POCT Wet Prep ([place]et [place]) - clotrimazole (GYNE-LOTRIMIN 3) 2 % vaginal cream; Place 1 Applicatorful vaginally at bedtime.  Dispense: 21 g; Refill: 3  2. BV (bacterial vaginosis) -Keep clean and dry - metroNIDAZOLE (FLAGYL) 500 MG tablet; Take 1 tablet (500 mg total) by mouth 2 (two) times daily.  Dispense: 14 tablet; Refill: 0   Evelina Dun, FNP

## 2015-01-21 LAB — OB RESULTS CONSOLE GBS: GBS: POSITIVE

## 2015-02-27 ENCOUNTER — Inpatient Hospital Stay (HOSPITAL_COMMUNITY)
Admission: AD | Admit: 2015-02-27 | Discharge: 2015-02-27 | Disposition: A | Discharge status: Home / Self Care | Payer: Medicaid Other | Source: Ambulatory Visit | Attending: Obstetrics and Gynecology | Admitting: Obstetrics and Gynecology

## 2015-02-27 ENCOUNTER — Encounter (HOSPITAL_COMMUNITY): Payer: Self-pay | Admitting: *Deleted

## 2015-02-27 DIAGNOSIS — Z3A26 26 weeks gestation of pregnancy: Secondary | ICD-10-CM | POA: Insufficient documentation

## 2015-02-27 DIAGNOSIS — B3731 Acute candidiasis of vulva and vagina: Secondary | ICD-10-CM

## 2015-02-27 DIAGNOSIS — O98812 Other maternal infectious and parasitic diseases complicating pregnancy, second trimester: Secondary | ICD-10-CM | POA: Insufficient documentation

## 2015-02-27 DIAGNOSIS — B373 Candidiasis of vulva and vagina: Secondary | ICD-10-CM | POA: Diagnosis not present

## 2015-02-27 DIAGNOSIS — O26892 Other specified pregnancy related conditions, second trimester: Secondary | ICD-10-CM | POA: Diagnosis present

## 2015-02-27 LAB — URINALYSIS, ROUTINE W REFLEX MICROSCOPIC
Bilirubin Urine: NEGATIVE
Glucose, UA: NEGATIVE mg/dL
Hgb urine dipstick: NEGATIVE
Ketones, ur: 15 mg/dL — AB
Nitrite: NEGATIVE
Protein, ur: 30 mg/dL — AB
Specific Gravity, Urine: 1.025 (ref 1.005–1.030)
pH: 6 (ref 5.0–8.0)

## 2015-02-27 LAB — URINE MICROSCOPIC-ADD ON

## 2015-02-27 LAB — AMNISURE RUPTURE OF MEMBRANE (ROM) NOT AT ARMC: Amnisure ROM: NEGATIVE

## 2015-02-27 MED ORDER — TERCONAZOLE 0.4 % VA CREA: 1.0000 | TOPICAL_CREAM | Freq: Every day | VAGINAL | Status: DC

## 2015-02-27 NOTE — Discharge Instructions (Signed)

## 2015-02-27 NOTE — MAU Provider Note (Signed)
History    Tracy Rojas is a 39y.o. [id number] at 26.6wks who presents for LOF.  Patient reports leaking started about 4 days ago and has been ongoing.  Patient denies smell or color to urine, but requires frequent change in underwear. Reports clear, like water. Patient denies issues with urination, constipation, or diarrhea.  Patient further denies N/V, while endorsing fetal movement.  No VB, ctx, or cramping.  Patient denies intercourse in the last 48 hours.  Patient Active Problem List   Diagnosis Date Noted  . Vitamin D deficiency 09/14/2014  . Migraine without aura 12/13/2012  . Tension headache 12/13/2012  . Thrombocytopenia ([place]) 02/17/2012    Chief Complaint  Patient presents with  . Rupture of Membranes   HPI  OB History    Gravida Para Term Preterm AB TAB SAB Ectopic Multiple Living   4 3 2 1      3       Past Medical History  Diagnosis Date  . Bronchitis 04/2012  . GERD (gastroesophageal reflux disease)   . [id number])     Past Surgical History  Procedure Laterality Date  . Tibia fracture surgery Left 2004    rod and two screws due to trauma    Family History  Problem Relation Age of Onset  . Hypertension Mother   . Migraines Mother     vascular migraines  . Hypertension Father   . Cancer Father     Throat    Social History  Substance Use Topics  . Smoking status: Never Smoker   . Smokeless tobacco: Never Used  . Alcohol Use: No    Allergies:  Allergies  Allergen Reactions  . Dimetapp C [Phenylephrine-Bromphen-Codeine] Other (See Comments)    Low platelets and hemotologist told her not to take it  . Ibuprofen Other (See Comments)    Told this makes her platelets low    Prescriptions prior to admission  Medication Sig Dispense Refill Last Dose  . Prenatal Vit-Fe Fumarate-FA (PRENATAL FORMULA) 28-0.8 MG TABS Take 1 tablet by mouth daily. 30 each 11 02/27/2015 at Unknown time  . clotrimazole (GYNE-LOTRIMIN 3) 2 % vaginal cream Place 1  Applicatorful vaginally at bedtime. (Patient not taking: Reported on 02/27/2015) 21 g 3   . metroNIDAZOLE (FLAGYL) 500 MG tablet Take 1 tablet (500 mg total) by mouth 2 (two) times daily. (Patient not taking: Reported on 02/27/2015) 14 tablet 0     ROS  See HPI Above Physical Exam   Blood pressure 114/68, pulse 104, temperature 97.9 F (36.6 C), temperature source Oral, resp. rate 18, last menstrual period 08/23/2014.  Results for orders placed or performed during the hospital encounter of 02/27/15 (from the past 24 hour(s))  Amnisure rupture of membrane (rom)not at [facility]     Status: None   Collection Time: 02/27/15  6:10 PM  Result Value Ref Range   Amnisure ROM NEGATIVE     Physical Exam  Vitals reviewed. Constitutional: She is oriented to person, place, and time. She appears well-developed and well-nourished. No distress.  HENT:  Head: Normocephalic and atraumatic.  Eyes: Pupils are equal, round, and reactive to light.  Neck: Normal range of motion.  Cardiovascular: Normal rate.   Respiratory: Effort normal.  GI: Soft.  Genitourinary: Cervix exhibits no discharge. No bleeding in the vagina. Vaginal discharge found.  Sterile Speculum Exam: -Vaginal Vault: Moderate amt thick curdy greenish white discharge -wet prep deferred -Cervix:Appears closed, no apparent discharge from os -Bimanual Exam: Closed/Long/Soft  Musculoskeletal: Normal range  of motion. She exhibits no edema.  Neurological: She is alert and oriented to person, place, and time.  Skin: Skin is warm and dry.     FHR: 135 bpm, Mod Var, -Decels, +Accels UC: None  ED Course  Assessment: IUP at 26.6wks Cat I FT Leaking of Fluid Candidiasis of vagina  Plan: -Amnisure negative as ordered by V.Standard, CNM -PE as above -Apparent yeast infection, will treat appropriately -Findings discussed with patient, no q/c -Terazol 0.4% for 7 nights at hs -Will send UA and report any abnormalities as  appropriate -Encouraged to call if any questions or concerns arise prior to next scheduled office visit.  -Keep appt as scheduled: Jan 12 -Discharged to home in stable condition   [place], MSN 02/27/2015 8:06 PM

## 2015-02-27 NOTE — MAU Note (Signed)
Pt presents to MAU with complaints of leakage of clear fluid this week. Denies any vaginal bleeding or cramping

## 2015-02-27 NOTE — MAU Note (Signed)
Urine sent to Lab

## 2015-03-03 NOTE — L&D Delivery Note (Signed)
Final Labor Progress Note At 1625 pt reports an increased in rectal pressure.  FHR remained reassuring   Vaginal Delivery Note Artificial rupture of membranes today, at 1534, clear.  GBS was positive, PCN x 1 doses were given.  Cervical dilation was complete at 1630.    Spontaneous self directed pushing began at  1630.   After 6 minutes of pushing the head, shoulders and the body of a viable female infant "Bevelyn Ngo" delivered spontaneously with maternal effort in the ROA position at 1636   Loose [place] x 1, Body cord x1, infant clinching cord in the left hand.  Unable to reduce, infant somersaulted thru without difficulty.   With vigorous tone and spontaneous cry, the infant was placed on moms abd.  After the umbilical cord was clamped it was cut by the FOB, then cord blood was obtained for evaluation.  Spontaneous delivery of a intact placenta with a 3 vessel cord via [place] at  1659.   Episiotomy: None   The vulva, perineum, vaginal vault, rectum and cervix were inspected and revealed a 1st degree perineal, repaired using a 3-0 vicryl on a CT needle, a  bilateral periurethral and a periclitoral laceration.  Both were repaired using a 4-0 vicryl on a SH needle with 30cc of 1% lidocaine.  Patient tolerated repair well.   Postpartum pitocin as ordered.  Fundus firm, lochia minimum, bleeding under control. EBL 100, Pt hemodynamically stable.   Sponge, laps and needle count correct and verified with the primary care nurse.  Attending MD available at all times.    Routine postpartum orders   Mother desires Essure for contraception  Mom plans to breastfeed   Placenta to pathology: NO     Cord Gases sent to lab: NO Cord blood sent to lab: YES   APGARS:  9 at 1 minute and 9 at 5 minutes Weight:. pending    Both mom and baby were left in stable condition, baby skin to skin.      Jamair Cato, CNM, MSN 05/17/2015. 5:45 PM

## 2015-04-04 ENCOUNTER — Ambulatory Visit (INDEPENDENT_AMBULATORY_CARE_PROVIDER_SITE_OTHER): Payer: Medicaid Other | Admitting: Nurse Practitioner

## 2015-04-04 ENCOUNTER — Encounter: Payer: Self-pay | Admitting: Nurse Practitioner

## 2015-04-04 VITALS — BP 128/87 | HR 113 | Temp 97.0°F | Ht 62.0 in | Wt 205.0 lb

## 2015-04-04 DIAGNOSIS — J029 Acute pharyngitis, unspecified: Secondary | ICD-10-CM | POA: Diagnosis not present

## 2015-04-04 LAB — POCT RAPID STREP A (OFFICE): Rapid Strep A Screen: NEGATIVE

## 2015-04-04 MED ORDER — AMOXICILLIN 500 MG PO CAPS: 500.0000 mg | ORAL_CAPSULE | Freq: Three times a day (TID) | ORAL | Status: DC

## 2015-04-04 NOTE — Patient Instructions (Signed)

## 2015-04-04 NOTE — Progress Notes (Signed)
  Subjective:     Tracy Rojas is a 40 y.o. female who presents for evaluation of sore throat. Associated symptoms include dry cough, sinus and nasal congestion and sore throat. Onset of symptoms was 1 day ago, and have been gradually worsening since that time. She is drinking plenty of fluids. She has not had a recent close exposure to someone with proven streptococcal pharyngitis.  The following portions of the patient's history were reviewed and updated as appropriate: allergies, current medications, past family history, past medical history, past social history, past surgical history and problem list.  Review of Systems Pertinent items are noted in HPI.    Objective:    BP 128/87 mmHg  Pulse 113  Temp(Src) 97 F (36.1 C) (Oral)  Ht 5\' 2"  (1.575 m)  Wt 205 lb (92.987 kg)  BMI 37.49 kg/m2  LMP 08/23/2014 General appearance: alert and cooperative Eyes: conjunctivae/corneas clear. PERRL, EOM's intact. Fundi benign. Ears: normal TM's and external ear canals both ears Nose: clear discharge, moderate congestion, turbinates red, sinus tenderness bilateral Throat: lips, mucosa, and tongue normal; teeth and gums normal Neck: no adenopathy, no carotid bruit, no JVD, supple, symmetrical, trachea midline and thyroid not enlarged, symmetric, no tenderness/mass/nodules Lungs: clear to auscultation bilaterally Heart: regular rate and rhythm, S1, S2 normal, no murmur, click, rub or gallop  Laboratory Strep test done. Results:negative.    Assessment:    Acute pharyngitis, likely  Upper resp infection.    Plan:  Force fluids Motrin or tylenol OTC OTC decongestant Throat lozenges if help New toothbrush in 3 days Meds ordered this encounter  Medications  . amoxicillin (AMOXIL) 500 MG capsule    Sig: Take 1 capsule (500 mg total) by mouth 3 (three) times daily.    Dispense:  30 capsule    Refill:  0    Order Specific Question:  Supervising Provider    Answer:  Chipper Herb  Fouke, FNP

## 2015-05-16 ENCOUNTER — Inpatient Hospital Stay (HOSPITAL_COMMUNITY)
Admission: AD | Admit: 2015-05-16 | Discharge: 2015-05-19 | DRG: 775 | Disposition: A | Discharge status: Home / Self Care | Payer: Medicaid Other | Source: Ambulatory Visit | Attending: Obstetrics & Gynecology | Admitting: Obstetrics & Gynecology

## 2015-05-16 ENCOUNTER — Encounter (HOSPITAL_COMMUNITY): Payer: Self-pay | Admitting: *Deleted

## 2015-05-16 DIAGNOSIS — Z8249 Family history of ischemic heart disease and other diseases of the circulatory system: Secondary | ICD-10-CM | POA: Diagnosis not present

## 2015-05-16 DIAGNOSIS — K219 Gastro-esophageal reflux disease without esophagitis: Secondary | ICD-10-CM | POA: Diagnosis present

## 2015-05-16 DIAGNOSIS — D693 Immune thrombocytopenic purpura: Secondary | ICD-10-CM | POA: Diagnosis present

## 2015-05-16 DIAGNOSIS — O1400 Mild to moderate pre-eclampsia, unspecified trimester: Secondary | ICD-10-CM | POA: Diagnosis present

## 2015-05-16 DIAGNOSIS — O9081 Anemia of the puerperium: Secondary | ICD-10-CM | POA: Diagnosis not present

## 2015-05-16 DIAGNOSIS — O9912 Other diseases of the blood and blood-forming organs and certain disorders involving the immune mechanism complicating childbirth: Secondary | ICD-10-CM | POA: Diagnosis present

## 2015-05-16 DIAGNOSIS — O1404 Mild to moderate pre-eclampsia, complicating childbirth: Secondary | ICD-10-CM | POA: Diagnosis present

## 2015-05-16 DIAGNOSIS — D649 Anemia, unspecified: Secondary | ICD-10-CM | POA: Diagnosis not present

## 2015-05-16 DIAGNOSIS — Z3A38 38 weeks gestation of pregnancy: Secondary | ICD-10-CM

## 2015-05-16 DIAGNOSIS — O9962 Diseases of the digestive system complicating childbirth: Secondary | ICD-10-CM | POA: Diagnosis present

## 2015-05-16 DIAGNOSIS — Z22330 Carrier of Group B streptococcus: Secondary | ICD-10-CM

## 2015-05-16 DIAGNOSIS — O99810 Abnormal glucose complicating pregnancy: Secondary | ICD-10-CM | POA: Diagnosis present

## 2015-05-16 DIAGNOSIS — O99824 Streptococcus B carrier state complicating childbirth: Secondary | ICD-10-CM | POA: Diagnosis present

## 2015-05-16 DIAGNOSIS — D696 Thrombocytopenia, unspecified: Secondary | ICD-10-CM | POA: Diagnosis present

## 2015-05-16 DIAGNOSIS — E559 Vitamin D deficiency, unspecified: Secondary | ICD-10-CM | POA: Diagnosis present

## 2015-05-16 DIAGNOSIS — O09529 Supervision of elderly multigravida, unspecified trimester: Secondary | ICD-10-CM

## 2015-05-16 HISTORY — DX: Mild to moderate pre-eclampsia, unspecified trimester: O14.00

## 2015-05-16 LAB — COMPREHENSIVE METABOLIC PANEL
ALT: 9 U/L — ABNORMAL LOW (ref 14–54)
AST: 25 U/L (ref 15–41)
Albumin: 3 g/dL — ABNORMAL LOW (ref 3.5–5.0)
Alkaline Phosphatase: 173 U/L — ABNORMAL HIGH (ref 38–126)
Anion gap: 5 (ref 5–15)
BUN: 8 mg/dL (ref 6–20)
CO2: 22 mmol/L (ref 22–32)
Calcium: 9 mg/dL (ref 8.9–10.3)
Chloride: 109 mmol/L (ref 101–111)
Creatinine, Ser: 0.68 mg/dL (ref 0.44–1.00)
GFR calc Af Amer: >60 mL/min (ref >60)
GFR calc non Af Amer: >60 mL/min (ref >60)
Glucose, Bld: 95 mg/dL (ref 65–99)
Potassium: 4.5 mmol/L (ref 3.5–5.1)
Sodium: 136 mmol/L (ref 135–145)
Total Bilirubin: 0.4 mg/dL (ref 0.3–1.2)
Total Protein: 5.8 g/dL — ABNORMAL LOW (ref 6.5–8.1)

## 2015-05-16 LAB — TYPE AND SCREEN
ABO/RH(D): B POS
Antibody Screen: NEGATIVE

## 2015-05-16 LAB — CBC
HCT: 35.9 % — ABNORMAL LOW (ref 36.0–46.0)
Hemoglobin: 12.1 g/dL (ref 12.0–15.0)
MCH: 30.1 pg (ref 26.0–34.0)
MCHC: 33.7 g/dL (ref 30.0–36.0)
MCV: 89.3 fL (ref 78.0–100.0)
Platelets: 95 10*3/uL — ABNORMAL LOW (ref 150–400)
RBC: 4.02 MIL/uL (ref 3.87–5.11)
RDW: 14.6 % (ref 11.5–15.5)
WBC: 11.2 10*3/uL — ABNORMAL HIGH (ref 4.0–10.5)

## 2015-05-16 LAB — URIC ACID: Uric Acid, Serum: 4.7 mg/dL (ref 2.3–6.6)

## 2015-05-16 LAB — LACTATE DEHYDROGENASE: LDH: 200 U/L — ABNORMAL HIGH (ref 98–192)

## 2015-05-16 MED ORDER — OXYCODONE-ACETAMINOPHEN 5-325 MG PO TABS: 1.0000 | ORAL_TABLET | ORAL | Status: DC | PRN

## 2015-05-16 MED ORDER — OXYTOCIN 10 UNIT/ML IJ SOLN: 2.5000 [IU]/h | INTRAVENOUS | Status: DC

## 2015-05-16 MED ORDER — LACTATED RINGERS IV SOLN
INTRAVENOUS | Status: DC
  Administered 2015-05-16 – 2015-05-17 (×2): via INTRAVENOUS

## 2015-05-16 MED ORDER — OXYTOCIN BOLUS FROM INFUSION: 500.0000 mL | INTRAVENOUS | Status: DC

## 2015-05-16 MED ORDER — LIDOCAINE HCL (PF) 1 % IJ SOLN
30.0000 mL | INTRAMUSCULAR | Status: AC | PRN
  Administered 2015-05-17: 30 mL via SUBCUTANEOUS
  Filled 2015-05-16 (×2): qty 30

## 2015-05-16 MED ORDER — TERBUTALINE SULFATE 1 MG/ML IJ SOLN: 0.2500 mg | Freq: Once | INTRAMUSCULAR | Status: DC | PRN

## 2015-05-16 MED ORDER — OXYCODONE-ACETAMINOPHEN 5-325 MG PO TABS: 2.0000 | ORAL_TABLET | ORAL | Status: DC | PRN

## 2015-05-16 MED ORDER — CITRIC ACID-SODIUM CITRATE 334-500 MG/5ML PO SOLN: 30.0000 mL | ORAL | Status: DC | PRN

## 2015-05-16 MED ORDER — MISOPROSTOL 25 MCG QUARTER TABLET: 25.0000 ug | ORAL_TABLET | ORAL | Status: DC | PRN

## 2015-05-16 MED ORDER — ACETAMINOPHEN 325 MG PO TABS: 650.0000 mg | ORAL_TABLET | ORAL | Status: DC | PRN

## 2015-05-16 MED ORDER — LACTATED RINGERS IV SOLN: 500.0000 mL | INTRAVENOUS | Status: DC | PRN

## 2015-05-16 MED ORDER — ONDANSETRON HCL 4 MG/2ML IJ SOLN: 4.0000 mg | Freq: Four times a day (QID) | INTRAMUSCULAR | Status: DC | PRN

## 2015-05-16 MED ORDER — OXYTOCIN 10 UNIT/ML IJ SOLN
1.0000 m[IU]/min | INTRAMUSCULAR | Status: DC
  Administered 2015-05-16: 2 m[IU]/min via INTRAVENOUS
  Filled 2015-05-16: qty 10

## 2015-05-16 MED ORDER — FLEET ENEMA 7-19 GM/118ML RE ENEM: 1.0000 | ENEMA | RECTAL | Status: DC | PRN

## 2015-05-17 ENCOUNTER — Encounter (HOSPITAL_COMMUNITY): Payer: Self-pay | Admitting: *Deleted

## 2015-05-17 DIAGNOSIS — Z22330 Carrier of Group B streptococcus: Secondary | ICD-10-CM

## 2015-05-17 DIAGNOSIS — O09529 Supervision of elderly multigravida, unspecified trimester: Secondary | ICD-10-CM

## 2015-05-17 DIAGNOSIS — O99810 Abnormal glucose complicating pregnancy: Secondary | ICD-10-CM | POA: Diagnosis present

## 2015-05-17 HISTORY — DX: Abnormal glucose complicating pregnancy: O99.810

## 2015-05-17 HISTORY — DX: Supervision of elderly multigravida, unspecified trimester: O09.529

## 2015-05-17 HISTORY — DX: Carrier of group B Streptococcus: Z22.330

## 2015-05-17 LAB — CBC
HCT: 33.6 % — ABNORMAL LOW (ref 36.0–46.0)
Hemoglobin: 11.2 g/dL — ABNORMAL LOW (ref 12.0–15.0)
MCH: 29.9 pg (ref 26.0–34.0)
MCHC: 33.3 g/dL (ref 30.0–36.0)
MCV: 89.6 fL (ref 78.0–100.0)
Platelets: 104 10*3/uL — ABNORMAL LOW (ref 150–400)
RBC: 3.75 MIL/uL — ABNORMAL LOW (ref 3.87–5.11)
RDW: 14.9 % (ref 11.5–15.5)
WBC: 11.5 10*3/uL — ABNORMAL HIGH (ref 4.0–10.5)

## 2015-05-17 LAB — PROTEIN / CREATININE RATIO, URINE
Creatinine, Urine: 124 mg/dL
Protein Creatinine Ratio: 0.26 mg/mg{Cre} — ABNORMAL HIGH (ref 0.00–0.15)
Total Protein, Urine: 32 mg/dL

## 2015-05-17 LAB — ABO/RH: ABO/RH(D): B POS

## 2015-05-17 MED ORDER — DEXTROSE 5 % IV SOLN
2.5000 10*6.[IU] | INTRAVENOUS | Status: DC
  Administered 2015-05-17: 2.5 10*6.[IU] via INTRAVENOUS
  Filled 2015-05-17 (×4): qty 2.5

## 2015-05-17 MED ORDER — LABETALOL HCL 5 MG/ML IV SOLN: 20.0000 mg | INTRAVENOUS | Status: DC | PRN

## 2015-05-17 MED ORDER — ONDANSETRON HCL 4 MG PO TABS: 4.0000 mg | ORAL_TABLET | ORAL | Status: DC | PRN

## 2015-05-17 MED ORDER — SIMETHICONE 80 MG PO CHEW
80.0000 mg | CHEWABLE_TABLET | ORAL | Status: DC | PRN
Start: 2015-05-17 — End: 2015-05-19

## 2015-05-17 MED ORDER — MAGNESIUM SULFATE BOLUS VIA INFUSION
4.0000 g | Freq: Once | INTRAVENOUS | Status: DC
  Filled 2015-05-17: qty 500

## 2015-05-17 MED ORDER — MAGNESIUM SULFATE 50 % IJ SOLN
2.0000 g/h | INTRAVENOUS | Status: DC
  Filled 2015-05-17: qty 80

## 2015-05-17 MED ORDER — LANOLIN HYDROUS EX OINT: TOPICAL_OINTMENT | CUTANEOUS | Status: DC | PRN

## 2015-05-17 MED ORDER — ACETAMINOPHEN 325 MG PO TABS: 650.0000 mg | ORAL_TABLET | ORAL | Status: DC | PRN

## 2015-05-17 MED ORDER — ZOLPIDEM TARTRATE 5 MG PO TABS: 5.0000 mg | ORAL_TABLET | Freq: Every evening | ORAL | Status: DC | PRN

## 2015-05-17 MED ORDER — TETANUS-DIPHTH-ACELL PERTUSSIS 5-2.5-18.5 LF-MCG/0.5 IM SUSP: 0.5000 mL | Freq: Once | INTRAMUSCULAR | Status: DC

## 2015-05-17 MED ORDER — DIPHENHYDRAMINE HCL 25 MG PO CAPS: 25.0000 mg | ORAL_CAPSULE | Freq: Four times a day (QID) | ORAL | Status: DC | PRN

## 2015-05-17 MED ORDER — ONDANSETRON HCL 4 MG/2ML IJ SOLN: 4.0000 mg | INTRAMUSCULAR | Status: DC | PRN

## 2015-05-17 MED ORDER — SENNOSIDES-DOCUSATE SODIUM 8.6-50 MG PO TABS
2.0000 | ORAL_TABLET | ORAL | Status: DC
  Administered 2015-05-19: 2 via ORAL
  Filled 2015-05-17: qty 2

## 2015-05-17 MED ORDER — FENTANYL CITRATE (PF) 100 MCG/2ML IJ SOLN: 50.0000 ug | INTRAMUSCULAR | Status: DC | PRN

## 2015-05-17 MED ORDER — HYDRALAZINE HCL 20 MG/ML IJ SOLN: 10.0000 mg | Freq: Once | INTRAMUSCULAR | Status: DC | PRN

## 2015-05-17 MED ORDER — BENZOCAINE-MENTHOL 20-0.5 % EX AERO
1.0000 "application " | INHALATION_SPRAY | CUTANEOUS | Status: DC | PRN
  Administered 2015-05-17: 1 via TOPICAL
  Filled 2015-05-17: qty 56

## 2015-05-17 MED ORDER — DIBUCAINE 1 % RE OINT
1.0000 "application " | TOPICAL_OINTMENT | RECTAL | Status: DC | PRN
  Administered 2015-05-18: 1 via RECTAL
  Filled 2015-05-17: qty 28

## 2015-05-17 MED ORDER — WITCH HAZEL-GLYCERIN EX PADS
1.0000 "application " | MEDICATED_PAD | CUTANEOUS | Status: DC | PRN
  Administered 2015-05-18: 1 via TOPICAL

## 2015-05-17 MED ORDER — PENICILLIN G POTASSIUM 5000000 UNITS IJ SOLR
5.0000 10*6.[IU] | Freq: Once | INTRAVENOUS | Status: AC
  Administered 2015-05-17: 5 10*6.[IU] via INTRAVENOUS
  Filled 2015-05-17: qty 5

## 2015-05-17 MED ORDER — PRENATAL MULTIVITAMIN CH
1.0000 | ORAL_TABLET | Freq: Every day | ORAL | Status: DC
  Administered 2015-05-18 – 2015-05-19 (×2): 1 via ORAL
  Filled 2015-05-17 (×2): qty 1
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
## 2015-05-17 NOTE — Progress Notes (Addendum)
Spoke to pt around 8 pm regarding Magnesium -- refused. Dr. Cletis Media made aware. BPs since 1900 = 125-148/70s-80s. Placed on BP parameters, prn IV anti-hypertensives and strict I&Os. Will measure BP every 4 hrs for now. Platelets 104 at 0733 today, up from 95 the evening prior. Denies h/a, visual changes, epigastric pain or difficulty breathing.  Gen: NAD Neuro: Grossly intact w/o focal deficits Lungs CTAB CV: RRR w/o M/R/G Abdomen: soft, appropriately tender, fundus firm at U Ext: 1+ DTRs, no clonus, 1-2+ generalized edema  Due to severe rxn to Dimetapp C (Phenylephrine-bromphen-codeine) and low platelets, pt told not to take, as well as Ibuprofen. Pt cannot recall which pain medication she has taken in the past for pain, therefore Tylenol order changed to scheduled versus prn - ok for pt to refuse med if not needed.  preE precautions reviewed. Will monitor closely.   Farrel Gordon, CNM 05/17/15, 11:53 PM

## 2015-05-17 NOTE — Progress Notes (Addendum)
At 2015-2045 RN at bedside discussing patient's decision to refuse the Magnesium Sulfate therapy. RN explained through teaching, to patient and spouse, signs and symptoms to look for: any vision changes such as blurred vision, headache, pain in the right side under the ribs, increase in swelling, less than 30cc or 1oz of urine output every hour, overall not feeling well and other worsening symptoms. RN explained that repeating labs on 05/18/15 and reviewing the results with the provider would be helpful in knowing if patient's status in improving, worsening or stable. Pt and husband taught to look at clonus by watch see and do one, both verbalized understanding and were appreciative. Provider updated on patient's decision and will continue to monitor. RN also continuing to monitor and will proceed with transfer to mother baby unit rm 119. Pt and spouse verbalized understanding and appreciative and had no further questions at time of transfer and/or teaching moments.

## 2015-05-17 NOTE — Progress Notes (Addendum)
CNM notified patient would like to speak with someone prior to having magnesium started. CNM requested RN to notify Dr. Alesia Richards and have her come speak to patient. Dr. Alesia Richards said she was with other patients and requests that V. Standard, CNM speak with patient when she becomes available. Per Dr. Alesia Richards it is okay to wait to start magnesium until a provider has spoken with patient.   Dr. Alesia Richards:  I spoke to patient at bedside about recommendation for magnesium sulfate for seizure prophylaxis given preeclampsia diagnosis.  I allowed patient to discuss further with her husband decision.  Nurse Sabino Snipes called me to inform me that patient had declined Magnesium sulfate and that she accepted the risk of not taking magnesium sulfate including risk of eclamptic seizures.  Patient stated that she would like an early discharge from hospital tomorrow.  We discussed this will only be possible if she remains stable without signs or symptoms of worsening preeclampsia.  Plan to keep patient in- house until at least 24 hours after delivery.  Will also repeat PI[facility] labs in the morning. Dr. Alesia Richards.

## 2015-05-17 NOTE — Progress Notes (Signed)
Labor Progress  Subjective: No complaints, fob on the sofa  Objective: BP 141/79 mmHg  Pulse 105  Temp(Src) 98 F (36.7 C) (Oral)  Resp 20  Ht 5\' 2"  (1.575 m)  Wt 215 lb (97.523 kg)  BMI 39.31 kg/m2  LMP 08/23/2014     FHT: 135, moderate variability, + accel, no decel CTX:  regular, every 3-5 minutes Uterus gravid, soft non tender SVE:  2-3/80/-1 Pitocin at 40mUn/min  Assessment:  IUP at 38.1 weeks IOL d/t Pre eclampsia NICHD: Category 1 Membranes:  intact  Induction:   Pitocin - 14  Pain management:  IV pain management:   GBS positve  [id number] PCN now  Plan: Continue labor plan Continuous monitoring Rest/Ambulate Frequent position changes to facilitate fetal rotation and descent. Will reassess with cervical exam at 1300 or earlier if necessary Continue pitocin per protocol      Natiya Seelinger, CNM, MSN 05/17/2015. 9:39 AM

## 2015-05-17 NOTE — Progress Notes (Signed)
Farrel Gordon CNM notified of Elevated BP. 148/83  See orders.

## 2015-05-17 NOTE — Progress Notes (Signed)
Subjective: Doing well, starting to feel some contractions.  Reports them as "cramping". Able to doze on and off throughout  Objective: BP 133/77 mmHg  Pulse 95  Temp(Src) 98.6 F (37 C) (Oral)  Resp 14  Ht 5\' 2"  (1.575 m)  Wt 97.523 kg (215 lb)  BMI 39.31 kg/m2  LMP 08/23/2014      FHT: Category 1, 135 moderate variability, +accels, no decels UC:   irregular, every 2-5 minutes, difficult to pick up on monitor when patient is on her side SVE:   Dilation: 2.5 Effacement (%): 80 Station: -1 Exam by:: R. 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 CNM, [place] 05/17/2015, 5:29 AM

## 2015-05-18 LAB — COMPREHENSIVE METABOLIC PANEL
ALT: 10 U/L — ABNORMAL LOW (ref 14–54)
AST: 26 U/L (ref 15–41)
Albumin: 2.4 g/dL — ABNORMAL LOW (ref 3.5–5.0)
Alkaline Phosphatase: 138 U/L — ABNORMAL HIGH (ref 38–126)
Anion gap: 3 — ABNORMAL LOW (ref 5–15)
BUN: 9 mg/dL (ref 6–20)
CO2: 23 mmol/L (ref 22–32)
Calcium: 8 mg/dL — ABNORMAL LOW (ref 8.9–10.3)
Chloride: 108 mmol/L (ref 101–111)
Creatinine, Ser: 0.63 mg/dL (ref 0.44–1.00)
GFR calc Af Amer: >60 mL/min (ref >60)
GFR calc non Af Amer: >60 mL/min (ref >60)
Glucose, Bld: 101 mg/dL — ABNORMAL HIGH (ref 65–99)
Potassium: 3.9 mmol/L (ref 3.5–5.1)
Sodium: 134 mmol/L — ABNORMAL LOW (ref 135–145)
Total Bilirubin: 0.5 mg/dL (ref 0.3–1.2)
Total Protein: 5.1 g/dL — ABNORMAL LOW (ref 6.5–8.1)

## 2015-05-18 LAB — RPR: RPR Ser Ql: NONREACTIVE

## 2015-05-18 LAB — CBC
HCT: 31.8 % — ABNORMAL LOW (ref 36.0–46.0)
Hemoglobin: 10.6 g/dL — ABNORMAL LOW (ref 12.0–15.0)
MCH: 29.9 pg (ref 26.0–34.0)
MCHC: 33.3 g/dL (ref 30.0–36.0)
MCV: 89.8 fL (ref 78.0–100.0)
Platelets: 103 10*3/uL — ABNORMAL LOW (ref 150–400)
RBC: 3.54 MIL/uL — ABNORMAL LOW (ref 3.87–5.11)
RDW: 15 % (ref 11.5–15.5)
WBC: 11 10*3/uL — ABNORMAL HIGH (ref 4.0–10.5)

## 2015-05-18 LAB — URIC ACID: Uric Acid, Serum: 4.8 mg/dL (ref 2.3–6.6)

## 2015-05-18 LAB — LACTATE DEHYDROGENASE: LDH: 196 U/L — ABNORMAL HIGH (ref 98–192)

## 2015-05-18 MED ORDER — ACETAMINOPHEN 325 MG PO TABS
650.0000 mg | ORAL_TABLET | ORAL | Status: DC
  Administered 2015-05-18 (×2): 650 mg via ORAL
  Filled 2015-05-18 (×2): qty 2

## 2015-05-18 MED ORDER — FERROUS SULFATE 325 (65 FE) MG PO TABS
325.0000 mg | ORAL_TABLET | Freq: Two times a day (BID) | ORAL | Status: DC
  Administered 2015-05-19: 325 mg via ORAL
  Filled 2015-05-18: qty 1

## 2015-05-18 NOTE — Progress Notes (Addendum)
Midwife Jimmye Norman called back with parameters of 160/110

## 2015-05-18 NOTE — Progress Notes (Signed)
Previous shift Rn had called Midwife williams regarding Patient's bp and parameters to call her with . Midwife williams suppose to call back.

## 2015-05-18 NOTE — Progress Notes (Addendum)
Tracy Rojas   Subjective: Post Partum Day 1 Vaginal delivery, 1st degree laceration, bilateral periurethral and a periclitoral laceration Patient up ad lib, denies syncope or dizziness. Reports consuming regular diet without issues and denies N/V No issues with urination and reports bleeding is appropriate  Feeding:  breast Contraceptive plan:   Essure  Objective: Temp:  [97.5 F (36.4 C)-98.9 F (37.2 C)] 97.7 F (36.5 C) (03/18 0647) Pulse Rate:  [81-111] 81 (03/18 0647) Resp:  [18-22] 18 (03/18 0647) BP: (96-149)/(62-94) 124/66 mmHg (03/18 0647) SpO2:  [98 %] 98 % (03/17 2100)  Filed Vitals:   05/17/15 2130 05/17/15 2215 05/18/15 0210 05/18/15 0647  BP: 134/82 148/86 121/76 124/66  Pulse: 87 83 98 81  Temp:  98.3 F (36.8 C) 98.7 F (37.1 C) 97.7 F (36.5 C)  TempSrc:  Oral Oral Oral  Resp: 18 18 18 18   Height:      Weight:      SpO2:        Physical Exam:  General: alert and cooperative Ext: WNL, no significant  edema. No evidence of DVT seen on physical exam. Breast: Soft filling Lungs: CTAB Heart RRR without murmur  Abdomen:  Soft, fundus firm, lochia scant, + bowel sounds, non distended, non tender Lochia: appropriate Uterine Fundus: firm Laceration: healing well    Recent Labs  05/17/15 0733 05/18/15 0630  HGB 11.2* 10.6*  HCT 33.6* 31.8*    Assessment S/P Vaginal Delivery-Day 1 Stable  Normal Involution Breastfeeding  pt refused Mag   Plan: Continue current care Plan for discharge tomorrow and Lactation consult Lactation support I&O BP q4  Tracy Rojas, CNM, MSN 05/18/2015, 11:14 AM

## 2015-05-19 MED ORDER — FERROUS SULFATE 325 (65 FE) MG PO TABS: 325.0000 mg | ORAL_TABLET | Freq: Two times a day (BID) | ORAL | Status: DC

## 2015-05-19 MED ORDER — LABETALOL HCL 100 MG PO TABS: 100.0000 mg | ORAL_TABLET | Freq: Two times a day (BID) | ORAL | Status: DC

## 2015-05-19 NOTE — Discharge Summary (Signed)
OB Discharge Summary  Patient Name: Tracy Rojas DOB: 1975-09-27 MRN: [id number]  Date of admission: 05/16/2015 Delivering MD: Sallee Provencal   Date of discharge: 05/19/2015  Admitting diagnosis: INDUCTION Intrauterine pregnancy: [redacted]w[redacted]d     Secondary diagnosis:Active Problems:   Thrombocytopenia (HCC)   Mild preeclampsia   Impaired glucose tolerance in pregnancy   Advanced maternal age in multigravida   GBS carrier   Normal vaginal delivery  Additional problems: Diagnosis Date Noted   . Impaired glucose tolerance in pregnancy 05/17/2015  . Advanced maternal age in multigravida 05/17/2015  . 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 Mild preeclampsia 05/16/2015  . Vitamin D deficiency 09/14/2014  . Migraine without aura 12/13/2012  . Tension headache 12/13/2012  . Thrombocytopenia [facility])             Discharge diagnosis: Term Pregnancy Delivered and Anemia                                                                     Post partum procedures:refused Magneisum   Augmentation: Pitocin  Complications: None  Hospital course:  Induction of Labor With Vaginal Delivery   40 y.o. yo [phone] at [redacted]w[redacted]d was admitted to the hospital 05/16/2015 for induction of labor.  Indication for induction: Preeclampsia.  Patient had an uncomplicated labor course as follows: Membrane Rupture Time/Date: 3:24 PM ,05/17/2015   Intrapartum Procedures: Episiotomy: None [1]                                         Lacerations:  1st degree [2];Perineal [11];Periurethral [8]  Patient had delivery of a Viable infant.  Information for the patient's newborn:  Shaneen, Haddox [id number]  Delivery Method: Vaginal, Spontaneous Delivery (Filed from Delivery Summary)   05/17/2015  Details of delivery can be found in separate delivery note.  Patient had a routine postpartum course. Patient is discharged home 05/19/2015.   Physical exam  Filed Vitals:   05/18/15 1824 05/18/15  2200 05/19/15 0200 05/19/15 0500  BP: 148/89 143/84 147/85 148/91  Pulse: 95 98 92 85  Temp: 97.9 F (36.6 C) 98.7 F (37.1 C) 97.9 F (36.6 C) 98 F (36.7 C)  TempSrc: Oral Oral Oral Oral  Resp: 18 18 20 18   Height:      Weight:      SpO2:       General: alert, cooperative and no distress Lochia: appropriate Uterine Fundus: firm Incision: Healing well with no significant drainage DVT Evaluation: No evidence of DVT seen on physical exam. Labs: Lab Results  Component Value Date   WBC 11.0* 05/18/2015   HGB 10.6* 05/18/2015   HCT 31.8* 05/18/2015   MCV 89.8 05/18/2015   PLT 103* 05/18/2015   CMP Latest Ref Rng 05/18/2015  Glucose 65 - 99 mg/dL 101(H)  BUN 6 - 20 mg/dL 9  Creatinine 0.44 - 1.00 mg/dL 0.63  Sodium 135 - 145 mmol/L 134(L)  Potassium 3.5 - 5.1 mmol/L 3.9  Chloride 101 - 111 mmol/L 108  CO2 22 - 32 mmol/L 23  Calcium 8.9 - 10.3 mg/dL 8.0(L)  Total Protein 6.5 - 8.1 g/dL 5.1(L)  Total  Bilirubin 0.3 - 1.2 mg/dL 0.5  Alkaline Phos 38 - 126 U/L 138(H)  AST 15 - 41 U/L 26  ALT 14 - 54 U/L 10(L)    Discharge instruction: per After Visit Summary and "Baby and Me Booklet".  Medications:  Current facility-administered medications:  .  acetaminophen (TYLENOL) tablet 650 mg, 650 mg, Oral, Q4H, Farrel Gordon, CNM, 650 mg at 05/18/15 0502 .  benzocaine-Menthol (DERMOPLAST) 20-0.5 % topical spray 1 application, 1 application, Topical, PRN, Deiondra Denley, CNM, 1 application at [id number] .  witch hazel-glycerin (TUCKS) pad 1 application, 1 application, Topical, PRN, 1 application at [id number] **AND** dibucaine (NUPERCAINAL) 1 % rectal ointment 1 application, 1 application, Rectal, PRN, Juletta Berhe, CNM, 1 application at [id number] .  diphenhydrAMINE (BENADRYL) capsule 25 mg, 25 mg, Oral, Q6H PRN, Jadarious Dobbins, CNM .  ferrous sulfate tablet 325 mg, 325 mg, Oral, BID WC, Wyatt Thorstenson, CNM .  hydrALAZINE (APRESOLINE) injection 10 mg, 10 mg,  Intravenous, Once PRN, Farrel Gordon, CNM .  labetalol (NORMODYNE,TRANDATE) injection 20-80 mg, 20-80 mg, Intravenous, Q10 min PRN, Farrel Gordon, CNM .  lanolin ointment, , Topical, PRN, Amal Renbarger, CNM .  lidocaine (PF) (XYLOCAINE) 1 % injection 30 mL, 30 mL, Subcutaneous, PRN, Lavetta Nielsen, CNM, 30 mL at 05/17/15 1649 .  ondansetron (ZOFRAN) tablet 4 mg, 4 mg, Oral, Q4H PRN **OR** ondansetron (ZOFRAN) injection 4 mg, 4 mg, Intravenous, Q4H PRN, [place]as Sena, CNM .  prenatal multivitamin tablet 1 tablet, 1 tablet, Oral, Q1200, Kenza Munar, CNM, 1 tablet at 05/18/15 1152 .  senna-docusate (Senokot-S) tablet 2 tablet, 2 tablet, Oral, Q24H, Dryden Tapley, CNM, 2 tablet at 05/19/15 0013 .  simethicone (MYLICON) chewable tablet 80 mg, 80 mg, Oral, PRN, Cyntia Staley, CNM .  Tdap (BOOSTRIX) injection 0.5 mL, 0.5 mL, Intramuscular, Once, Bogdan Vivona, CNM, 0.5 mL at 05/18/15 1000 .  zolpidem (AMBIEN) tablet 5 mg, 5 mg, Oral, QHS PRN, Nazeer Romney, CNM After Visit Meds:    Medication List    ASK your doctor about these medications        amoxicillin 500 MG capsule  Commonly known as:  AMOXIL  Take 1 capsule (500 mg total) by mouth 3 (three) times daily.     calcium carbonate 750 MG chewable tablet  Commonly known as:  TUMS EX  Chew 3-4 tablets by mouth daily as needed for heartburn.     PRENATAL FORMULA 28-0.8 MG Tabs  Take 1 tablet by mouth daily.        Diet: routine diet  Activity: Advance as tolerated. Pelvic rest for 6 weeks.   Outpatient follow up:6 weeks, smart start nurse to visit on Wednesday 05/22/15 Follow up Appt:No future appointments. Follow up visit: No Follow-up on file.  Postpartum contraception: Essure  Newborn Data: Live born female  Birth Weight: 6 lb 11.8 oz (3055 g) APGAR: 9, 9  Baby Feeding: Bottle Disposition:home with mother   05/19/2015 Taylorann Tkach, CNM      Postpartum Care After Vaginal Delivery  After you deliver  your newborn (postpartum period), the usual stay in the hospital is 24 72 hours. If there were problems with your labor or delivery, or if you have other medical problems, you might be in the hospital longer.  While you are in the hospital, you will receive help and instructions on how to care for yourself and your newborn during the postpartum period.  While you are in the hospital:  Be sure to tell your nurses if  you have pain or discomfort, as well as where you feel the pain and what makes the pain worse.  If you had an incision made near your vagina (episiotomy) or if you had some tearing during delivery, the nurses may put ice packs on your episiotomy or tear. The ice packs may help to reduce the pain and swelling.  If you are breastfeeding, you may feel uncomfortable contractions of your uterus for a couple of weeks. This is normal. The contractions help your uterus get back to normal size.  It is normal to have some bleeding after delivery.  For the first 1 3 days after delivery, the flow is red and the amount may be similar to a period.  It is common for the flow to start and stop.  In the first few days, you may pass some small clots. Let your nurses know if you begin to pass large clots or your flow increases.  Do not  flush blood clots down the toilet before having the nurse look at them.  During the next 3 10 days after delivery, your flow should become more watery and pink or brown-tinged in color.  Ten to fourteen days after delivery, your flow should be a small amount of yellowish-white discharge.  The amount of your flow will decrease over the first few weeks after delivery. Your flow may stop in 6 8 weeks. Most women have had their flow stop by 12 weeks after delivery.  You should change your sanitary pads frequently.  Wash your hands thoroughly with soap and water for at least 20 seconds after changing pads, using the toilet, or before holding or feeding your  newborn.  You should feel like you need to empty your bladder within the first 6 8 hours after delivery.  In case you become weak, lightheaded, or faint, call your nurse before you get out of bed for the first time and before you take a shower for the first time.  Within the first few days after delivery, your breasts may begin to feel tender and full. This is called engorgement. Breast tenderness usually goes away within 48 72 hours after engorgement occurs. You may also notice milk leaking from your breasts. If you are not breastfeeding, do not stimulate your breasts. Breast stimulation can make your breasts produce more milk.  Spending as much time as possible with your newborn is very important. During this time, you and your newborn can feel close and get to know each other. Having your newborn stay in your room (rooming in) will help to strengthen the bond with your newborn. It will give you time to get to know your newborn and become comfortable caring for your newborn.  Your hormones change after delivery. Sometimes the hormone changes can temporarily cause you to feel sad or tearful. These feelings should not last more than a few days. If these feelings last longer than that, you should talk to your caregiver.  If desired, talk to your caregiver about methods of family planning or contraception.  Talk to your caregiver about immunizations. Your caregiver may want you to have the following immunizations before leaving the hospital:  Tetanus, diphtheria, and pertussis (Tdap) or tetanus and diphtheria (Td) immunization. It is very important that you and your family (including grandparents) or others caring for your newborn are up-to-date with the Tdap or Td immunizations. The Tdap or Td immunization can help protect your newborn from getting ill.  Rubella immunization.  Varicella (chickenpox) immunization.  Influenza  immunization. You should receive this annual immunization if you did  not receive the immunization during your pregnancy. Document Released: 12/14/2006 Document Revised: 11/11/2011 Document Reviewed: 10/14/2011 [facility] Patient Information 2014 [place].   Postpartum Depression and Baby Blues  The postpartum period begins right after the birth of a baby. During this time, there is often a great amount of joy and excitement. It is also a time of considerable changes in the life of the parent(s). Regardless of how many times a mother gives birth, each child brings new challenges and dynamics to the family. It is not unusual to have feelings of excitement accompanied by confusing shifts in moods, emotions, and thoughts. All mothers are at risk of developing postpartum depression or the "baby blues." These mood changes can occur right after giving birth, or they may occur many months after giving birth. The baby blues or postpartum depression can be mild or severe. Additionally, postpartum depression can resolve rather quickly, or it can be a long-term condition. CAUSES Elevated hormones and their rapid decline are thought to be a main cause of postpartum depression and the baby blues. There are a number of hormones that radically change during and after pregnancy. Estrogen and progesterone usually decrease immediately after delivering your baby. The level of thyroid hormone and various cortisol steroids also rapidly drop. Other factors that play a major role in these changes include major life events and genetics.  RISK FACTORS If you have any of the following risks for the baby blues or postpartum depression, know what symptoms to watch out for during the postpartum period. Risk factors that may increase the likelihood of getting the baby blues or postpartum depression include: 1. Havinga personal or family history of depression. 2. Having depression while being pregnant. 3. Having premenstrual or oral contraceptive-associated mood issues. 4. Having exceptional  life stress. 5. Having marital conflict. 6. Lacking a social support network. 7. Having a baby with special needs. 8. Having health problems such as diabetes. SYMPTOMS Baby blues symptoms include:  Brief fluctuations in mood, such as going from extreme happiness to sadness.  Decreased concentration.  Difficulty sleeping.  Crying spells, tearfulness.  Irritability.  Anxiety. Postpartum depression symptoms typically begin within the first month after giving birth. These symptoms include:  Difficulty sleeping or excessive sleepiness.  Marked weight loss.  Agitation.  Feelings of worthlessness.  Lack of interest in activity or food. Postpartum psychosis is a very concerning condition and can be dangerous. Fortunately, it is rare. Displaying any of the following symptoms is cause for immediate medical attention. Postpartum psychosis symptoms include:  Hallucinations and delusions.  Bizarre or disorganized behavior.  Confusion or disorientation. DIAGNOSIS  A diagnosis is made by an evaluation of your symptoms. There are no medical or lab tests that lead to a diagnosis, but there are various questionnaires that a caregiver may use to identify those with the baby blues, postpartum depression, or psychosis. Often times, a screening tool called the [place] Postnatal Depression Scale is used to diagnose depression in the postpartum period.  TREATMENT The baby blues usually goes away on its own in 1 to 2 weeks. Social support is often all that is needed. You should be encouraged to get adequate sleep and rest. Occasionally, you may be given medicines to help you sleep.  Postpartum depression requires treatment as it can last several months or longer if it is not treated. Treatment may include individual or group therapy, medicine, or both to address any social, physiological, and psychological  factors that may play a role in the depression. Regular exercise, a healthy diet, rest, and  social support may also be strongly recommended.  Postpartum psychosis is more serious and needs treatment right away. Hospitalization is often needed. HOME CARE INSTRUCTIONS  Get as much rest as you can. Nap when the baby sleeps.  Exercise regularly. Some women find yoga and walking to be beneficial.  Eat a balanced and nourishing diet.  Do little things that you enjoy. Have a cup of tea, take a bubble bath, read your favorite magazine, or listen to your favorite music.  Avoid alcohol.  Ask for help with household chores, cooking, grocery shopping, or running errands as needed. Do not try to do everything.  Talk to people close to you about how you are feeling. Get support from your partner, family members, friends, or other new moms.  Try to stay positive in how you think. Think about the things you are grateful for.  Do not spend a lot of time alone.  Only take medicine as directed by your caregiver.  Keep all your postpartum appointments.  Let your caregiver know if you have any concerns. SEEK MEDICAL CARE IF: You are having a reaction or problems with your medicine. SEEK IMMEDIATE MEDICAL CARE IF:  You have suicidal feelings.  You feel you may harm the baby or someone else. Document Released: 11/21/2003 Document Revised: 05/11/2011 Document Reviewed: 12/23/2010 [facility] Patient Information 2014 [place], [place].     Breastfeeding Deciding to breastfeed is one of the best choices you can make for you and your baby. A change in hormones during pregnancy causes your breast tissue to grow and increases the number and size of your milk ducts. These hormones also allow proteins, sugars, and fats from your blood supply to make breast milk in your milk-producing glands. Hormones prevent breast milk from being released before your baby is born as well as prompt milk flow after birth. Once breastfeeding has begun, thoughts of your baby, as well as his or her sucking or crying, can  stimulate the release of milk from your milk-producing glands.  BENEFITS OF BREASTFEEDING For Your Baby  Your first milk (colostrum) helps your baby's digestive system function better.   There are antibodies in your milk that help your baby fight off infections.   Your baby has a lower incidence of asthma, allergies, and sudden infant death syndrome.   The nutrients in breast milk are better for your baby than infant formulas and are designed uniquely for your baby's needs.   Breast milk improves your baby's brain development.   Your baby is less likely to develop other conditions, such as childhood obesity, asthma, or type 2 diabetes mellitus.  For You   Breastfeeding helps to create a very special bond between you and your baby.   Breastfeeding is convenient. Breast milk is always available at the correct temperature and costs nothing.   Breastfeeding helps to burn calories and helps you lose the weight gained during pregnancy.   Breastfeeding makes your uterus contract to its prepregnancy size faster and slows bleeding (lochia) after you give birth.   Breastfeeding helps to lower your risk of developing type 2 diabetes mellitus, osteoporosis, and breast or ovarian cancer later in life. SIGNS THAT YOUR BABY IS HUNGRY Early Signs of Hunger  Increased alertness or activity.  Stretching.  Movement of the head from side to side.  Movement of the head and opening of the mouth when the corner of the mouth  or cheek is stroked (rooting).  Increased sucking sounds, smacking lips, cooing, sighing, or squeaking.  Hand-to-mouth movements.  Increased sucking of fingers or hands. Late Signs of Hunger  Fussing.  Intermittent crying. Extreme Signs of Hunger Signs of extreme hunger will require calming and consoling before your baby will be able to breastfeed successfully. Do not wait for the following signs of extreme hunger to occur before you initiate breastfeeding:    Restlessness.  A loud, strong cry.   Screaming.   BREASTFEEDING BASICS Breastfeeding Initiation  Find a comfortable place to sit or lie down, with your neck and back well supported.  Place a pillow or rolled up blanket under your baby to bring him or her to the level of your breast (if you are seated). Nursing pillows are specially designed to help support your arms and your baby while you breastfeed.  Make sure that your baby's abdomen is facing your abdomen.   Gently massage your breast. With your fingertips, massage from your chest wall toward your nipple in a circular motion. This encourages milk flow. You may need to continue this action during the feeding if your milk flows slowly.  Support your breast with 4 fingers underneath and your thumb above your nipple. Make sure your fingers are well away from your nipple and your baby's mouth.   Stroke your baby's lips gently with your finger or nipple.   When your baby's mouth is open wide enough, quickly bring your baby to your breast, placing your entire nipple and as much of the colored area around your nipple (areola) as possible into your baby's mouth.   More areola should be visible above your baby's upper lip than below the lower lip.   Your baby's tongue should be between his or her lower gum and your breast.   Ensure that your baby's mouth is correctly positioned around your nipple (latched). Your baby's lips should create a seal on your breast and be turned out (everted).  It is common for your baby to suck about 2-3 minutes in order to start the flow of breast milk. Latching Teaching your baby how to latch on to your breast properly is very important. An improper latch can cause nipple pain and decreased milk supply for you and poor weight gain in your baby. Also, if your baby is not latched onto your nipple properly, he or she may swallow some air during feeding. This can make your baby fussy. Burping your baby  when you switch breasts during the feeding can help to get rid of the air. However, teaching your baby to latch on properly is still the best way to prevent fussiness from swallowing air while breastfeeding. Signs that your baby has successfully latched on to your nipple:    Silent tugging or silent sucking, without causing you pain.   Swallowing heard between every 3-4 sucks.    Muscle movement above and in front of his or her ears while sucking.  Signs that your baby has not successfully latched on to nipple:   Sucking sounds or smacking sounds from your baby while breastfeeding.  Nipple pain. If you think your baby has not latched on correctly, slip your finger into the corner of your baby's mouth to break the suction and place it between your baby's gums. Attempt breastfeeding initiation again. Signs of Successful Breastfeeding Signs from your baby:   A gradual decrease in the number of sucks or complete cessation of sucking.   Falling asleep.  Relaxation of his or her body.   Retention of a small amount of milk in his or her mouth.   Letting go of your breast by himself or herself. Signs from you:  Breasts that have increased in firmness, weight, and size 1-3 hours after feeding.   Breasts that are softer immediately after breastfeeding.  Increased milk volume, as well as a change in milk consistency and color by the fifth day of breastfeeding.   Nipples that are not sore, cracked, or bleeding. Signs That Your [employer]el Books is Getting Enough Milk  Wetting at least 3 diapers in a 24-hour period. The urine should be clear and pale yellow by age 33 days.  At least 3 stools in a 24-hour period by age 33 days. The stool should be soft and yellow.  At least 3 stools in a 24-hour period by age 38 days. The stool should be seedy and yellow.  No loss of weight greater than 10% of birth weight during the first 88 days of age.  Average weight gain of 4-7 ounces (113-198 g) per  week after age 29 days.  Consistent daily weight gain by age 383 days, without weight loss after the age of 2 weeks. After a feeding, your baby may spit up a small amount. This is common. BREASTFEEDING FREQUENCY AND DURATION Frequent feeding will help you make more milk and can prevent sore nipples and breast engorgement. Breastfeed when you feel the need to reduce the fullness of your breasts or when your baby shows signs of hunger. This is called "breastfeeding on demand." Avoid introducing a pacifier to your baby while you are working to establish breastfeeding (the first 4-6 weeks after your baby is born). After this time you may choose to use a pacifier. Research has shown that pacifier use during the first year of a baby's life decreases the risk of sudden infant death syndrome (SIDS). Allow your baby to feed on each breast as long as he or she wants. Breastfeed until your baby is finished feeding. When your baby unlatches or falls asleep while feeding from the first breast, offer the second breast. Because newborns are often sleepy in the first few weeks of life, you may need to awaken your baby to get him or her to feed. Breastfeeding times will vary from baby to baby. However, the following rules can serve as a guide to help you ensure that your baby is properly fed:  Newborns (babies 74 weeks of age or younger) may breastfeed every 1-3 hours.  Newborns should not go longer than 3 hours during the day or 5 hours during the night without breastfeeding.  You should breastfeed your baby a minimum of 8 times in a 24-hour period until you begin to introduce solid foods to your baby at around 18 months of age. BREAST MILK PUMPING Pumping and storing breast milk allows you to ensure that your baby is exclusively fed your breast milk, even at times when you are unable to breastfeed. This is especially important if you are going back to work while you are still breastfeeding or when you are not able to be  present during feedings. Your lactation consultant can give you guidelines on how long it is safe to store breast milk.  A breast pump is a machine that allows you to pump milk from your breast into a sterile bottle. The pumped breast milk can then be stored in a refrigerator or freezer. Some breast pumps are operated by hand, while others  use electricity. Ask your lactation consultant which type will work best for you. Breast pumps can be purchased, but some hospitals and breastfeeding support groups lease breast pumps on a monthly basis. A lactation consultant can teach you how to hand express breast milk, if you prefer not to use a pump.  CARING FOR YOUR BREASTS WHILE YOU BREASTFEED Nipples can become dry, cracked, and sore while breastfeeding. The following recommendations can help keep your breasts moisturized and healthy:  Avoid using soap on your nipples.   Wear a supportive bra. Although not required, special nursing bras and tank tops are designed to allow access to your breasts for breastfeeding without taking off your entire bra or top. Avoid wearing underwire-style bras or extremely tight bras.  Air dry your nipples for 3-68minutes after each feeding.   Use only cotton bra pads to absorb leaked breast milk. Leaking of breast milk between feedings is normal.   Use lanolin on your nipples after breastfeeding. Lanolin helps to maintain your skin's normal moisture barrier. If you use pure lanolin, you do not need to wash it off before feeding your baby again. Pure lanolin is not toxic to your baby. You may also hand express a few drops of breast milk and gently massage that milk into your nipples and allow the milk to air dry. In the first few weeks after giving birth, some women experience extremely full breasts (engorgement). Engorgement can make your breasts feel heavy, warm, and tender to the touch. Engorgement peaks within 3-5 days after you give birth. The following recommendations can  help ease engorgement:  Completely empty your breasts while breastfeeding or pumping. You may want to start by applying warm, moist heat (in the shower or with warm water-soaked hand towels) just before feeding or pumping. This increases circulation and helps the milk flow. If your baby does not completely empty your breasts while breastfeeding, pump any extra milk after he or she is finished.  Wear a snug bra (nursing or regular) or tank top for 1-2 days to signal your body to slightly decrease milk production.  Apply ice packs to your breasts, unless this is too uncomfortable for you.  Make sure that your baby is latched on and positioned properly while breastfeeding. If engorgement persists after 48 hours of following these recommendations, contact your health care provider or a Science writer. OVERALL HEALTH CARE RECOMMENDATIONS WHILE BREASTFEEDING  Eat healthy foods. Alternate between meals and snacks, eating 3 of each per day. Because what you eat affects your breast milk, some of the foods may make your baby more irritable than usual. Avoid eating these foods if you are sure that they are negatively affecting your baby.  Drink milk, fruit juice, and water to satisfy your thirst (about 10 glasses a day).   Rest often, relax, and continue to take your prenatal vitamins to prevent fatigue, stress, and anemia.  Continue breast self-awareness checks.  Avoid chewing and smoking tobacco.  Avoid alcohol and drug use. Some medicines that may be harmful to your baby can pass through breast milk. It is important to ask your health care provider before taking any medicine, including all over-the-counter and prescription medicine as well as vitamin and herbal supplements. It is possible to become pregnant while breastfeeding. If birth control is desired, ask your health care provider about options that will be safe for your baby. SEEK MEDICAL CARE IF:   You feel like you want to stop  breastfeeding or have become frustrated with breastfeeding.  You have painful breasts or nipples.  Your nipples are cracked or bleeding.  Your breasts are red, tender, or warm.  You have a swollen area on either breast.  You have a fever or chills.  You have nausea or vomiting.  You have drainage other than breast milk from your nipples.  Your breasts do not become full before feedings by the fifth day after you give birth.  You feel sad and depressed.  Your baby is too sleepy to eat well.  Your baby is having trouble sleeping.   Your baby is wetting less than 3 diapers in a 24-hour period.  Your baby has less than 3 stools in a 24-hour period.  Your baby's skin or the white part of his or her eyes becomes yellow.   Your baby is not gaining weight by 3 days of age. SEEK IMMEDIATE MEDICAL CARE IF:   Your baby is overly tired (lethargic) and does not want to wake up and feed.  Your baby develops an unexplained fever. Document Released: 02/16/2005 Document Revised: 02/21/2013 Document Reviewed: 08/10/2012 [facility] Patient Information 2015 [place], [place]. This information is not intended to replace advice given to you by your health care provider. Make sure you discuss any questions you have with your health care provider.

## 2015-06-20 ENCOUNTER — Encounter (INDEPENDENT_AMBULATORY_CARE_PROVIDER_SITE_OTHER): Payer: Self-pay

## 2015-08-24 ENCOUNTER — Encounter (HOSPITAL_COMMUNITY): Payer: Self-pay

## 2015-08-24 ENCOUNTER — Inpatient Hospital Stay (HOSPITAL_COMMUNITY)
Admission: AD | Admit: 2015-08-24 | Discharge: 2015-08-24 | Discharge status: Against Medical Advice | Payer: Medicaid Other | Source: Ambulatory Visit | Attending: Obstetrics and Gynecology | Admitting: Obstetrics and Gynecology

## 2015-08-24 ENCOUNTER — Emergency Department (HOSPITAL_COMMUNITY)
Admission: EM | Admit: 2015-08-24 | Discharge: 2015-08-24 | Disposition: A | Discharge status: Home / Self Care | Payer: Medicaid Other | Attending: Emergency Medicine | Admitting: Emergency Medicine

## 2015-08-24 DIAGNOSIS — Z5321 Procedure and treatment not carried out due to patient leaving prior to being seen by health care provider: Secondary | ICD-10-CM | POA: Insufficient documentation

## 2015-08-24 DIAGNOSIS — T8339XA Other mechanical complication of intrauterine contraceptive device, initial encounter: Secondary | ICD-10-CM | POA: Diagnosis not present

## 2015-08-24 DIAGNOSIS — Y763 Surgical instruments, materials and obstetric and gynecological devices (including sutures) associated with adverse incidents: Secondary | ICD-10-CM | POA: Insufficient documentation

## 2015-08-24 DIAGNOSIS — T8389XA Other specified complication of genitourinary prosthetic devices, implants and grafts, initial encounter: Secondary | ICD-10-CM

## 2015-08-24 NOTE — MAU Note (Signed)
Urine sent to lab 

## 2015-08-24 NOTE — MAU Note (Signed)
Had baby 3 months ago. IUD placed 5/3. I was showering tonight and felt IUD. Feels like it has fallen down and I can feel it when I sit. Some spotting now but think it is my period.

## 2015-08-24 NOTE — Discharge Instructions (Signed)
I talked to the doctor on call tonight for Dr. Mancel Bale. She said this can be followed up in the office on Tuesday that you already have scheduled. However if you get a fever or you start having a lot of abdominal pain you should go to [facility] to be evaluated.

## 2015-08-24 NOTE — ED Provider Notes (Signed)
CSN: [id number]     Arrival date & time 08/24/15  0344 History   First MD Initiated Contact with Patient 08/24/15 [phone]   Chief Complaint  Patient presents with  . IUD Problem      (Consider location/radiation/quality/duration/timing/severity/associated sxs/prior Treatment) HPI  Patient reports she had IUD inserted on May 3 at [place]. She states this evening she was bathing and she thought she felt something plastic at the opening of her vagina. She denies any pain. She states she did start having some bleeding today, which is the first. She's had since the IUD was inserted. She states she's had IUDs in the past. Patient states she went to [facility] hospital tonight and waited 3 hours to be seen and left.  GYN Dr Mancel Bale, has an appt in 3 days  Past Medical History  Diagnosis Date  . Bronchitis 04/2012  . GERD (gastroesophageal reflux disease)   . [id number])    Past Surgical History  Procedure Laterality Date  . Tibia fracture surgery Left 2004    rod and two screws due to trauma   Family History  Problem Relation Age of Onset  . Hypertension Mother   . Migraines Mother     vascular migraines  . Hypertension Father   . Cancer Father     Throat   Social History  Substance Use Topics  . Smoking status: Never Smoker   . Smokeless tobacco: Never Used  . Alcohol Use: No   employed  OB History    Gravida Para Term Preterm AB TAB SAB Ectopic Multiple Living   4 4 3 1      0 3     Review of Systems  All other systems reviewed and are negative.     Allergies  Dimetapp c and Ibuprofen  Home Medications   Prior to Admission medications   Medication Sig Start Date End Date Taking? Authorizing Provider  ferrous sulfate 325 (65 FE) MG tablet Take 1 tablet (325 mg total) by mouth 2 (two) times daily with a meal. 05/19/15   Venus Standard, CNM  labetalol (NORMODYNE) 100 MG tablet Take 1 tablet (100 mg total) by mouth 2 (two) times daily. 05/19/15   Venus  Standard, CNM  Prenatal Vit-Fe Fumarate-FA (PRENATAL FORMULA) 28-0.8 MG TABS Take 1 tablet by mouth daily. 10/05/14   Claretta Fraise, MD   BP 121/78 mmHg  Pulse 82  Temp(Src) 97.6 F (36.4 C) (Oral)  Resp 16  Ht 5\' 3"  (1.6 m)  Wt 201 lb (91.173 kg)  BMI 35.61 kg/m2  SpO2 98%  LMP 08/23/2015 Physical Exam  Constitutional: She is oriented to person, place, and time. She appears well-developed and well-nourished.  Non-toxic appearance. She does not appear ill. No distress.  HENT:  Head: Normocephalic and atraumatic.  Right Ear: External ear normal.  Left Ear: External ear normal.  Nose: Nose normal. No mucosal edema or rhinorrhea.  Mouth/Throat: Oropharynx is clear and moist and mucous membranes are normal. No dental abscesses or uvula swelling.  Eyes: Conjunctivae and EOM are normal. Pupils are equal, round, and reactive to light.  Neck: Normal range of motion and full passive range of motion without pain. Neck supple.  Cardiovascular: Normal rate, regular rhythm and normal heart sounds.  Exam reveals no gallop and no friction rub.   No murmur heard. Pulmonary/Chest: Effort normal and breath sounds normal. No respiratory distress. She has no wheezes. She has no rhonchi. She has no rales. She exhibits no tenderness and no crepitus.  Abdominal: Soft. Normal appearance and bowel sounds are normal. She exhibits no distension. There is no tenderness. There is no rebound and no guarding.  Genitourinary:  Patient has a small amount of dark blood around her cervix. The stem of the T-shaped IUD is seen protruding from her cervix. The string is intact and in her vagina.  Musculoskeletal: Normal range of motion. She exhibits no edema or tenderness.  Moves all extremities well.   Neurological: She is alert and oriented to person, place, and time. She has normal strength. No cranial nerve deficit.  Skin: Skin is warm, dry and intact. No rash noted. No erythema. No pallor.  Psychiatric: She has a  normal mood and affect. Her speech is normal and behavior is normal. Her mood appears not anxious.  Nursing note and vitals reviewed.   ED Course  Procedures (including critical care time)  5:57 AM patient was discussed with Joelene Millin, nurse midwife for [facility]. She is going to discuss the patient with her attending.  6:20 AM Joelene Millin, has talked to [place], OB/GYN attending. She states that this can be followed up as an outpatient visit that she already has scheduled on June 27. She states the patient should be seen however sooner and at [facility] hospital if she gets fever or abdominal pain.    MDM   Final diagnoses:  IUD complication, initial encounter    Plan discharge  Rolland Porter, MD, Barbette Or, MD 08/24/15 [phone]

## 2015-08-24 NOTE — ED Notes (Signed)
Pt had an IUD placed May 2, states this am while taking a shower she noticed the string is lower and is concerned that the IUD is coming out.  Pt has been having mild bleeding and mild cramping.

## 2015-08-24 NOTE — MAU Note (Signed)
Pt called to go to room in MAU and not in lobby

## 2016-02-07 ENCOUNTER — Encounter: Payer: Self-pay | Admitting: Family Medicine

## 2016-02-07 ENCOUNTER — Ambulatory Visit (INDEPENDENT_AMBULATORY_CARE_PROVIDER_SITE_OTHER): Payer: Medicaid Other | Admitting: Family Medicine

## 2016-02-07 VITALS — BP 124/85 | HR 92 | Temp 98.6°F | Ht 63.0 in | Wt 211.0 lb

## 2016-02-07 DIAGNOSIS — J0100 Acute maxillary sinusitis, unspecified: Secondary | ICD-10-CM | POA: Diagnosis not present

## 2016-02-07 MED ORDER — BETAMETHASONE SOD PHOS & ACET 6 (3-3) MG/ML IJ SUSP
6.0000 mg | Freq: Once | INTRAMUSCULAR | Status: AC
  Administered 2016-02-07: 6 mg via INTRAMUSCULAR

## 2016-02-07 MED ORDER — AMOXICILLIN-POT CLAVULANATE 875-125 MG PO TABS: 1.0000 | ORAL_TABLET | Freq: Two times a day (BID) | ORAL | 0 refills | Status: DC

## 2016-02-07 MED ORDER — PSEUDOEPHEDRINE-GUAIFENESIN ER 120-1200 MG PO TB12: 1.0000 | ORAL_TABLET | Freq: Two times a day (BID) | ORAL | 0 refills | Status: DC

## 2016-02-07 NOTE — Progress Notes (Signed)
Subjective:  Patient ID: Tracy Rojas, female    DOB: 13-Mar-1975  Age: 40 y.o. MRN: [id number]  CC: No chief complaint on file.   HPI Tracy Rojas presents for Symptoms include congestion, facial pain, nasal congestion, no  fever, productive cough, post nasal drip and sinus pressure with no fever, chills, night sweats or weight loss. Onset of symptoms was about 10 days ago, gradually worsening since that time. Pt.is using Nyquil & Delsym without improvement     History Tracy Rojas has a past medical history of Bronchitis (04/2012); GERD (gastroesophageal reflux disease); and Headache(784.0).   She has a past surgical history that includes Tibia fracture surgery (Left, 2004).   Her family history includes Cancer in her father; Hypertension in her father and mother; Migraines in her mother.She reports that she has never smoked. She has never used smokeless tobacco. She reports that she does not drink alcohol or use drugs.  No current outpatient prescriptions on file prior to visit.   No current facility-administered medications on file prior to visit.     ROS Review of Systems  Constitutional: Negative for activity change, appetite change, chills and fever.  HENT: Positive for congestion, postnasal drip, rhinorrhea and sinus pressure. Negative for ear discharge, ear pain, hearing loss, nosebleeds, sneezing and trouble swallowing.   Respiratory: Negative for chest tightness and shortness of breath.   Cardiovascular: Negative for chest pain and palpitations.  Skin: Negative for rash.    Objective:  BP 124/85   Pulse 92   Temp 98.6 F (37 C) (Oral)   Ht 5\' 3"  (1.6 m)   Wt 211 lb (95.7 kg)   BMI 37.38 kg/m   Physical Exam  Constitutional: She appears well-developed and well-nourished.  HENT:  Head: Normocephalic and atraumatic.  Right Ear: Tympanic membrane and external ear normal. No decreased hearing is noted.  Left Ear: Tympanic membrane and external ear normal. No  decreased hearing is noted.  Nose: Mucosal edema present. Right sinus exhibits no frontal sinus tenderness. Left sinus exhibits no frontal sinus tenderness.  Mouth/Throat: No oropharyngeal exudate or posterior oropharyngeal erythema.  Neck: No Brudzinski's sign noted.  Pulmonary/Chest: Breath sounds normal. No respiratory distress.  Lymphadenopathy:       Head (right side): No preauricular adenopathy present.       Head (left side): No preauricular adenopathy present.       Right cervical: No superficial cervical adenopathy present.      Left cervical: No superficial cervical adenopathy present.    Assessment & Plan:   Diagnoses and all orders for this visit:  Acute maxillary sinusitis, recurrence not specified -     betamethasone acetate-betamethasone sodium phosphate (CELESTONE) injection 6 mg; Inject 1 mL (6 mg total) into the muscle once.  Other orders -     amoxicillin-clavulanate (AUGMENTIN) 875-125 MG tablet; Take 1 tablet by mouth 2 (two) times daily. Take all of this medication -     Pseudoephedrine-Guaifenesin [phone] MG TB12; Take 1 tablet by mouth 2 (two) times daily. For congestion   I have discontinued Tracy Rojas's PRENATAL FORMULA, ferrous sulfate, and labetalol. I am also having her start on amoxicillin-clavulanate and Pseudoephedrine-Guaifenesin. We will continue to administer betamethasone acetate-betamethasone sodium phosphate.  Meds ordered this encounter  Medications  . betamethasone acetate-betamethasone sodium phosphate (CELESTONE) injection 6 mg  . amoxicillin-clavulanate (AUGMENTIN) 875-125 MG tablet    Sig: Take 1 tablet by mouth 2 (two) times daily. Take all of this medication    Dispense:  20 tablet    Refill:  0  . Pseudoephedrine-Guaifenesin [phone] MG TB12    Sig: Take 1 tablet by mouth 2 (two) times daily. For congestion    Dispense:  20 each    Refill:  0     Follow-up: Return if symptoms worsen or fail to improve.  Claretta Fraise, M.D.

## 2016-02-23 ENCOUNTER — Inpatient Hospital Stay (HOSPITAL_COMMUNITY): Payer: Medicaid Other

## 2016-02-23 ENCOUNTER — Encounter (HOSPITAL_COMMUNITY): Payer: Self-pay | Admitting: *Deleted

## 2016-02-23 ENCOUNTER — Inpatient Hospital Stay (HOSPITAL_COMMUNITY)
Admission: AD | Admit: 2016-02-23 | Discharge: 2016-02-23 | Disposition: A | Discharge status: Home / Self Care | Payer: Medicaid Other | Source: Ambulatory Visit | Attending: Obstetrics and Gynecology | Admitting: Obstetrics and Gynecology

## 2016-02-23 DIAGNOSIS — Z3A10 10 weeks gestation of pregnancy: Secondary | ICD-10-CM | POA: Diagnosis not present

## 2016-02-23 DIAGNOSIS — N939 Abnormal uterine and vaginal bleeding, unspecified: Secondary | ICD-10-CM

## 2016-02-23 DIAGNOSIS — O209 Hemorrhage in early pregnancy, unspecified: Secondary | ICD-10-CM | POA: Diagnosis not present

## 2016-02-23 DIAGNOSIS — O99611 Diseases of the digestive system complicating pregnancy, first trimester: Secondary | ICD-10-CM | POA: Insufficient documentation

## 2016-02-23 DIAGNOSIS — K219 Gastro-esophageal reflux disease without esophagitis: Secondary | ICD-10-CM | POA: Insufficient documentation

## 2016-02-23 DIAGNOSIS — Z79899 Other long term (current) drug therapy: Secondary | ICD-10-CM | POA: Insufficient documentation

## 2016-02-23 LAB — URINALYSIS, ROUTINE W REFLEX MICROSCOPIC
Bilirubin Urine: NEGATIVE
Glucose, UA: NEGATIVE mg/dL
Hgb urine dipstick: NEGATIVE
Ketones, ur: NEGATIVE mg/dL
Leukocytes, UA: NEGATIVE
Nitrite: NEGATIVE
Protein, ur: NEGATIVE mg/dL
Specific Gravity, Urine: 1.027 (ref 1.005–1.030)
pH: 7 (ref 5.0–8.0)

## 2016-02-23 LAB — CBC
HCT: 38.8 % (ref 36.0–46.0)
Hemoglobin: 13.1 g/dL (ref 12.0–15.0)
MCH: 29.8 pg (ref 26.0–34.0)
MCHC: 33.8 g/dL (ref 30.0–36.0)
MCV: 88.2 fL (ref 78.0–100.0)
Platelets: 148 10*3/uL — ABNORMAL LOW (ref 150–400)
RBC: 4.4 MIL/uL (ref 3.87–5.11)
RDW: 14.8 % (ref 11.5–15.5)
WBC: 9.3 10*3/uL (ref 4.0–10.5)

## 2016-02-23 LAB — WET PREP, GENITAL
Clue Cells Wet Prep HPF POC: NONE SEEN
Sperm: NONE SEEN
Trich, Wet Prep: NONE SEEN
Yeast Wet Prep HPF POC: NONE SEEN

## 2016-02-23 LAB — HCG, QUANTITATIVE, PREGNANCY: hCG, Beta Chain, Quant, S: 58467 m[IU]/mL — ABNORMAL HIGH (ref <5)

## 2016-02-23 LAB — ABO/RH: ABO/RH(D): B POS

## 2016-02-23 NOTE — MAU Provider Note (Signed)
History   Patient Tracy Rojas is a 40 year old G5P3013 at 10 weeks by LMP here after passing two blood clots from her vagina today. She has had mild suprapubic cramping over the past 4 days but did not become worried until she passed the clots. She denies lower back pain, flank pain and painful urination or urgency.   CSN: [id number]  Arrival date and time: 02/23/16 2114   None     Chief Complaint  Patient presents with  . Vaginal Bleeding   Vaginal Bleeding  The patient's primary symptoms include vaginal bleeding. The patient's pertinent negatives include no genital itching, genital lesions, genital odor, genital rash, missed menses, pelvic pain or vaginal discharge. This is a new problem. The current episode started today. The problem occurs 2 to 4 times per day. The problem has been unchanged. The pain is mild. She is pregnant. Associated symptoms include abdominal pain. Pertinent negatives include no anorexia, back pain, chills, constipation, diarrhea, discolored urine, dysuria, fever, flank pain, frequency, headaches, hematuria, joint pain, joint swelling, nausea, painful intercourse, rash, sore throat, urgency or vomiting. The vaginal discharge was bloody. She has been passing clots. Nothing aggravates the symptoms. She has tried nothing for the symptoms.    OB History    Gravida Para Term Preterm AB Living   5 4 3 1   3    SAB TAB Ectopic Multiple Live Births         0 3      Past Medical History:  Diagnosis Date  . Bronchitis 04/2012  . GERD (gastroesophageal reflux disease)   . [id number])     Past Surgical History:  Procedure Laterality Date  . TIBIA FRACTURE SURGERY Left 2004   rod and two screws due to trauma    Family History  Problem Relation Age of Onset  . Hypertension Mother   . Migraines Mother     vascular migraines  . Hypertension Father   . Cancer Father     Throat    Social History  Substance Use Topics  . Smoking status: Never Smoker   . Smokeless tobacco: Never Used  . Alcohol use No    Allergies:  Allergies  Allergen Reactions  . Dimetapp C [Phenylephrine-Bromphen-Codeine] Other (See Comments)    Pt's mom reports that pt had facial and hand swelling  . Ibuprofen Other (See Comments)    Told this makes her platelets low    Prescriptions Prior to Admission  Medication Sig Dispense Refill Last Dose  . amoxicillin-clavulanate (AUGMENTIN) 875-125 MG tablet Take 1 tablet by mouth 2 (two) times daily. Take all of this medication 20 tablet 0   . Pseudoephedrine-Guaifenesin [phone] MG TB12 Take 1 tablet by mouth 2 (two) times daily. For congestion 20 each 0     Review of Systems  Constitutional: Negative for chills and fever.  HENT: Negative for sore throat.   Gastrointestinal: Positive for abdominal pain. Negative for anorexia, constipation, diarrhea, nausea and vomiting.  Genitourinary: Positive for vaginal bleeding. Negative for dysuria, flank pain, frequency, hematuria, missed menses, pelvic pain, urgency and vaginal discharge.  Musculoskeletal: Negative for back pain and joint pain.  Skin: Negative for rash.  Neurological: Negative for headaches.   Physical Exam   Blood pressure 114/87, pulse 90, temperature 98.2 F (36.8 C), temperature source Axillary, last menstrual period 12/17/2015, not currently breastfeeding.  Physical Exam  Constitutional: She is oriented to person, place, and time. She appears well-developed and well-nourished.  HENT:  Head: Normocephalic and atraumatic.  Neck: Normal range of motion.  Cardiovascular: Normal rate.   Respiratory: Effort normal. No respiratory distress. She has no wheezes. She has no rales. She exhibits no tenderness.  GI: Soft. She exhibits no distension and no mass. There is no tenderness. There is no rebound and no guarding.  Genitourinary: Vagina normal.  Genitourinary Comments: External genitalia and vaginal walls normal appearing with no lesions or erythema.  Scant brown blood present in the vaginal vault. Cervix is friable with bleeding upon contact with a swab. No CMT.   Musculoskeletal: Normal range of motion.  Neurological: She is alert and oriented to person, place, and time.  Skin: Skin is warm and dry.  Psychiatric: She has a normal mood and affect.    MAU Course  Procedures  MDM -UA: normal -wet prep: normal -GC CT cultures pending -[place]: SIUP at 6 weeks and 5 days with positive fetal cardiac activity -ABO: B positive Assessment and Plan   1. Vaginal bleeding in pregnancy, first trimester   2. Vaginal bleeding    2. Patient to be discharged home with instructions to keep her appointment with Dr. Melba Coon on 03/03/2016.  3. Reviewed bleeding precautions in 1st trimester, including when to return to MAU (such as worsening bleeding, cramping or passing of clots larger than a golf ball).  4. Plan of care reviewed with Dr. Terri Piedra, who agrees.   Mervyn Skeeters Dylin Ihnen CNM 02/23/2016, 11:22 PM

## 2016-02-23 NOTE — MAU Note (Signed)
2 Blood clots since this morning, mucous discharge

## 2016-02-23 NOTE — Discharge Instructions (Signed)
Vaginal Bleeding During Pregnancy, First Trimester °A small amount of bleeding (spotting) from the vagina is common in early pregnancy. Sometimes the bleeding is normal and is not a problem, and sometimes it is a sign of something serious. Be sure to tell your doctor about any bleeding from your vagina right away. °Follow these instructions at home: °· Watch your condition for any changes. °· Follow your doctor's instructions about how active you can be. °· If you are on bed rest: °¨ You may need to stay in bed and only get up to use the bathroom. °¨ You may be allowed to do some activities. °¨ If you need help, make plans for someone to help you. °· Write down: °¨ The number of pads you use each day. °¨ How often you change pads. °¨ How soaked (saturated) your pads are. °· Do not use tampons. °· Do not douche. °· Do not have sex or orgasms until your doctor says it is okay. °· If you pass any tissue from your vagina, save the tissue so you can show it to your doctor. °· Only take medicines as told by your doctor. °· Do not take aspirin because it can make you bleed. °· Keep all follow-up visits as told by your doctor. °Contact a doctor if: °· You bleed from your vagina. °· You have cramps. °· You have labor pains. °· You have a fever that does not go away after you take medicine. °Get help right away if: °· You have very bad cramps in your back or belly (abdomen). °· You pass large clots or tissue from your vagina. °· You bleed more. °· You feel light-headed or weak. °· You pass out (faint). °· You have chills. °· You are leaking fluid or have a gush of fluid from your vagina. °· You pass out while pooping (having a bowel movement). °This information is not intended to replace advice given to you by your health care provider. Make sure you discuss any questions you have with your health care provider. °Document Released: 07/03/2013 Document Revised: 07/25/2015 Document Reviewed: 10/24/2012 °Elsevier Interactive  Patient Education © 2017 [employer] ° °

## 2016-02-25 LAB — GC/CHLAMYDIA PROBE AMP (~~LOC~~) NOT AT ARMC
Chlamydia: NEGATIVE
Neisseria Gonorrhea: NEGATIVE

## 2016-03-02 NOTE — L&D Delivery Note (Signed)
Delivery Note Pt presented C/C/+2 with ROM.  Pushed for less than 10 minutes for delivery.  At 6:52 AM a viable and healthy female was delivered via Vaginal, Spontaneous Delivery (Presentation: OA; LOT  ).  APGAR: 8, 9; weight P  .   Placenta status: delivered, intact .  Cord: 3V  with the following complications: nuchal x 2.5.   Anesthesia:  none Episiotomy: None Lacerations: perineal abrasion Suture Repair: 3.0 vicryl rapide Est. Blood Loss (mL): 250cc  Mom to postpartum.  Baby to Couplet care / Skin to Skin.  Edith Endave 10/05/2016, 7:18 AM  Bo/Tdap in PNC/RI/B+/interval BTL

## 2016-03-03 LAB — OB RESULTS CONSOLE GC/CHLAMYDIA
Chlamydia: NEGATIVE
Gonorrhea: NEGATIVE

## 2016-03-03 LAB — OB RESULTS CONSOLE ABO/RH: RH Type: POSITIVE

## 2016-03-03 LAB — OB RESULTS CONSOLE HEPATITIS B SURFACE ANTIGEN: Hepatitis B Surface Ag: NEGATIVE

## 2016-03-03 LAB — OB RESULTS CONSOLE ANTIBODY SCREEN: Antibody Screen: NEGATIVE

## 2016-03-03 LAB — OB RESULTS CONSOLE HIV ANTIBODY (ROUTINE TESTING): HIV: NONREACTIVE

## 2016-03-03 LAB — OB RESULTS CONSOLE RUBELLA ANTIBODY, IGM: Rubella: IMMUNE

## 2016-03-03 LAB — OB RESULTS CONSOLE RPR: RPR: NONREACTIVE

## 2016-09-10 LAB — OB RESULTS CONSOLE GBS: GBS: POSITIVE

## 2016-09-23 ENCOUNTER — Telehealth (HOSPITAL_COMMUNITY): Payer: Self-pay | Admitting: *Deleted

## 2016-09-23 ENCOUNTER — Encounter (HOSPITAL_COMMUNITY): Payer: Self-pay | Admitting: *Deleted

## 2016-09-23 NOTE — Telephone Encounter (Signed)
Preadmission screen  

## 2016-10-01 ENCOUNTER — Encounter (HOSPITAL_COMMUNITY): Payer: Self-pay

## 2016-10-01 DIAGNOSIS — Z3483 Encounter for supervision of other normal pregnancy, third trimester: Secondary | ICD-10-CM

## 2016-10-01 HISTORY — DX: Encounter for supervision of other normal pregnancy, third trimester: Z34.83

## 2016-10-01 NOTE — H&P (Deleted)
  The note originally documented on this encounter has been moved the the encounter in which it belongs.  
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
## 2016-10-05 ENCOUNTER — Inpatient Hospital Stay (HOSPITAL_COMMUNITY)
Admission: AD | Admit: 2016-10-05 | Discharge: 2016-10-07 | DRG: 775 | Disposition: A | Discharge status: Home / Self Care | Payer: Medicaid Other | Source: Ambulatory Visit | Attending: Obstetrics and Gynecology | Admitting: Obstetrics and Gynecology

## 2016-10-05 ENCOUNTER — Inpatient Hospital Stay (HOSPITAL_COMMUNITY)
Admission: RE | Admit: 2016-10-05 | Discharge: 2016-10-05 | Disposition: A | Discharge status: Home / Self Care | Payer: Medicaid Other | Source: Ambulatory Visit | Attending: Obstetrics and Gynecology | Admitting: Obstetrics and Gynecology

## 2016-10-05 ENCOUNTER — Encounter (HOSPITAL_COMMUNITY): Payer: Self-pay | Admitting: Obstetrics and Gynecology

## 2016-10-05 DIAGNOSIS — O99824 Streptococcus B carrier state complicating childbirth: Secondary | ICD-10-CM | POA: Diagnosis present

## 2016-10-05 DIAGNOSIS — Z3483 Encounter for supervision of other normal pregnancy, third trimester: Secondary | ICD-10-CM

## 2016-10-05 DIAGNOSIS — D696 Thrombocytopenia, unspecified: Secondary | ICD-10-CM | POA: Diagnosis present

## 2016-10-05 DIAGNOSIS — O9912 Other diseases of the blood and blood-forming organs and certain disorders involving the immune mechanism complicating childbirth: Secondary | ICD-10-CM | POA: Diagnosis present

## 2016-10-05 DIAGNOSIS — Z3A39 39 weeks gestation of pregnancy: Secondary | ICD-10-CM | POA: Diagnosis not present

## 2016-10-05 DIAGNOSIS — O26893 Other specified pregnancy related conditions, third trimester: Secondary | ICD-10-CM | POA: Diagnosis present

## 2016-10-05 HISTORY — DX: Encounter for supervision of other normal pregnancy, third trimester: Z34.83

## 2016-10-05 LAB — TYPE AND SCREEN
ABO/RH(D): B POS
Antibody Screen: NEGATIVE

## 2016-10-05 LAB — RPR: RPR Ser Ql: NONREACTIVE

## 2016-10-05 MED ORDER — ONDANSETRON HCL 4 MG/2ML IJ SOLN: 4.0000 mg | Freq: Four times a day (QID) | INTRAMUSCULAR | Status: DC | PRN

## 2016-10-05 MED ORDER — PRENATAL MULTIVITAMIN CH
1.0000 | ORAL_TABLET | Freq: Every day | ORAL | Status: DC
  Administered 2016-10-05 – 2016-10-07 (×3): 1 via ORAL
  Filled 2016-10-05 (×3): qty 1

## 2016-10-05 MED ORDER — FLEET ENEMA 7-19 GM/118ML RE ENEM: 1.0000 | ENEMA | RECTAL | Status: DC | PRN

## 2016-10-05 MED ORDER — DIBUCAINE 1 % RE OINT
1.0000 "application " | TOPICAL_OINTMENT | RECTAL | Status: DC | PRN
  Administered 2016-10-05: 1 via RECTAL
  Filled 2016-10-05: qty 28

## 2016-10-05 MED ORDER — SIMETHICONE 80 MG PO CHEW: 80.0000 mg | CHEWABLE_TABLET | ORAL | Status: DC | PRN

## 2016-10-05 MED ORDER — WITCH HAZEL-GLYCERIN EX PADS
1.0000 "application " | MEDICATED_PAD | CUTANEOUS | Status: DC | PRN
  Administered 2016-10-05: 1 via TOPICAL

## 2016-10-05 MED ORDER — SOD CITRATE-CITRIC ACID 500-334 MG/5ML PO SOLN: 30.0000 mL | ORAL | Status: DC | PRN

## 2016-10-05 MED ORDER — ONDANSETRON HCL 4 MG/2ML IJ SOLN: 4.0000 mg | INTRAMUSCULAR | Status: DC | PRN

## 2016-10-05 MED ORDER — LIDOCAINE HCL (PF) 1 % IJ SOLN: 30.0000 mL | INTRAMUSCULAR | Status: DC | PRN

## 2016-10-05 MED ORDER — DIPHENHYDRAMINE HCL 25 MG PO CAPS: 25.0000 mg | ORAL_CAPSULE | Freq: Four times a day (QID) | ORAL | Status: DC | PRN

## 2016-10-05 MED ORDER — ACETAMINOPHEN 325 MG PO TABS: 650.0000 mg | ORAL_TABLET | ORAL | Status: DC | PRN

## 2016-10-05 MED ORDER — OXYCODONE-ACETAMINOPHEN 5-325 MG PO TABS: 1.0000 | ORAL_TABLET | ORAL | Status: DC | PRN

## 2016-10-05 MED ORDER — OXYTOCIN 10 UNIT/ML IJ SOLN
INTRAMUSCULAR | Status: AC
Start: 2016-10-05 — End: 2016-10-05
  Administered 2016-10-05: 10 [IU]
  Filled 2016-10-05: qty 1

## 2016-10-05 MED ORDER — COCONUT OIL OIL: 1.0000 "application " | TOPICAL_OIL | Status: DC | PRN

## 2016-10-05 MED ORDER — IBUPROFEN 600 MG PO TABS
600.0000 mg | ORAL_TABLET | Freq: Four times a day (QID) | ORAL | Status: DC
  Administered 2016-10-07: 600 mg via ORAL
  Filled 2016-10-05 (×5): qty 1

## 2016-10-05 MED ORDER — ZOLPIDEM TARTRATE 5 MG PO TABS: 5.0000 mg | ORAL_TABLET | Freq: Every evening | ORAL | Status: DC | PRN

## 2016-10-05 MED ORDER — SENNOSIDES-DOCUSATE SODIUM 8.6-50 MG PO TABS
2.0000 | ORAL_TABLET | ORAL | Status: DC
  Administered 2016-10-05: 2 via ORAL
  Filled 2016-10-05 (×2): qty 2

## 2016-10-05 MED ORDER — BENZOCAINE-MENTHOL 20-0.5 % EX AERO
1.0000 "application " | INHALATION_SPRAY | CUTANEOUS | Status: DC | PRN
  Administered 2016-10-05: 1 via TOPICAL
  Filled 2016-10-05: qty 56

## 2016-10-05 MED ORDER — OXYTOCIN BOLUS FROM INFUSION: 500.0000 mL | Freq: Once | INTRAVENOUS | Status: DC

## 2016-10-05 MED ORDER — ONDANSETRON HCL 4 MG PO TABS: 4.0000 mg | ORAL_TABLET | ORAL | Status: DC | PRN

## 2016-10-05 MED ORDER — LIDOCAINE HCL (PF) 1 % IJ SOLN
INTRAMUSCULAR | Status: AC
  Administered 2016-10-05: 30 mL
  Filled 2016-10-05: qty 30

## 2016-10-05 MED ORDER — OXYTOCIN 40 UNITS IN LACTATED RINGERS INFUSION - SIMPLE MED
INTRAVENOUS | Status: AC
  Filled 2016-10-05: qty 1000

## 2016-10-05 MED ORDER — OXYCODONE-ACETAMINOPHEN 5-325 MG PO TABS: 2.0000 | ORAL_TABLET | ORAL | Status: DC | PRN

## 2016-10-05 MED ORDER — LACTATED RINGERS IV SOLN
INTRAVENOUS | Status: DC
Start: 2016-10-05 — End: 2016-10-05

## 2016-10-05 MED ORDER — ACETAMINOPHEN 325 MG PO TABS
650.0000 mg | ORAL_TABLET | ORAL | Status: DC | PRN
  Administered 2016-10-06: 650 mg via ORAL
  Filled 2016-10-05: qty 2

## 2016-10-05 MED ORDER — LACTATED RINGERS IV SOLN: 500.0000 mL | INTRAVENOUS | Status: DC | PRN

## 2016-10-05 MED ORDER — OXYTOCIN 40 UNITS IN LACTATED RINGERS INFUSION - SIMPLE MED: 2.5000 [IU]/h | INTRAVENOUS | Status: DC

## 2016-10-05 MED ORDER — OXYCODONE HCL 5 MG PO TABS: 5.0000 mg | ORAL_TABLET | ORAL | Status: DC | PRN

## 2016-10-05 MED ORDER — OXYCODONE HCL 5 MG PO TABS: 10.0000 mg | ORAL_TABLET | ORAL | Status: DC | PRN

## 2016-10-05 NOTE — Progress Notes (Signed)
SVD @0652 ; delivered by Dr Melba Coon.

## 2016-10-05 NOTE — Progress Notes (Addendum)
Patient ID: Tracy Rojas, female   DOB: 08-13-1975, 41 y.o.   MRN: [id number]  H&P addendum  Pt presented to hospital in active labor, with ROM. C/C/+2  Contractions started at 5am, ROM in car.  Pt is gbbs+  To L&D for delivery

## 2016-10-05 NOTE — Progress Notes (Signed)
Pt feeling urge to push; R.Dawson,CNM in room/standby for delivery; Dr Melba Coon on the way

## 2016-10-06 LAB — CBC
HCT: 36.3 % (ref 36.0–46.0)
HCT: 32.4 % — ABNORMAL LOW (ref 36.0–46.0)
Hemoglobin: 11.8 g/dL — ABNORMAL LOW (ref 12.0–15.0)
Hemoglobin: 10.7 g/dL — ABNORMAL LOW (ref 12.0–15.0)
MCH: 28.3 pg (ref 26.0–34.0)
MCH: 28.6 pg (ref 26.0–34.0)
MCHC: 32.5 g/dL (ref 30.0–36.0)
MCHC: 33 g/dL (ref 30.0–36.0)
MCV: 87.1 fL (ref 78.0–100.0)
MCV: 86.6 fL (ref 78.0–100.0)
Platelets: 103 10*3/uL — ABNORMAL LOW (ref 150–400)
Platelets: 104 10*3/uL — ABNORMAL LOW (ref 150–400)
RBC: 4.17 MIL/uL (ref 3.87–5.11)
RBC: 3.74 MIL/uL — ABNORMAL LOW (ref 3.87–5.11)
RDW: 16.4 % — ABNORMAL HIGH (ref 11.5–15.5)
RDW: 16.7 % — ABNORMAL HIGH (ref 11.5–15.5)
WBC: 9.5 10*3/uL (ref 4.0–10.5)
WBC: 10.9 10*3/uL — ABNORMAL HIGH (ref 4.0–10.5)

## 2016-10-06 NOTE — Progress Notes (Signed)
Patient ID: Tracy Rojas, female   DOB: 03-09-1975, 41 y.o.   MRN: [id number] PPD #1 No problems Afeb, VSS Fundus firm, NT at U-1 Platelets stable at 104k Continue routine postpartum care

## 2016-10-06 NOTE — Plan of Care (Signed)
Problem: Nutritional: Goal: Mothers verbalization of comfort with breastfeeding process will improve Mom not breastfeeding  Outcome: Not Applicable Date Met: 84/57/33 Bottle feeding

## 2016-10-07 MED ORDER — IBUPROFEN 600 MG PO TABS: 600.0000 mg | ORAL_TABLET | Freq: Four times a day (QID) | ORAL | 1 refills | Status: DC | PRN

## 2016-10-07 NOTE — Progress Notes (Signed)
Patient ID: Tracy Rojas, female   DOB: Nov 06, 1975, 41 y.o.   MRN: [id number] Pt doing well. Pain well controlled, ambulating and tolerating diet well. Denies CP/HA or lightheadedness. Bonding well wth baby - bottle feeding. Ready for discharge to home today VSS ABD - FF and below umbilicus EXT - no homans  A/P: PPD#2 s/p svd - stable         Discharge instructions reviewed         F/U in 3 weeks to discuss pp BTL         6 week routine pp visit

## 2016-10-07 NOTE — Discharge Instructions (Signed)
Nothing in vagina for 6 weeks.  No sex, tampons, and douching.  Other instructions as in [facility] Discharge Booklet. °

## 2016-10-07 NOTE — Discharge Summary (Signed)
OB Discharge Summary     Patient Name: Tracy Rojas DOB: March 24, 1975 MRN: [id number]  Date of admission: 10/05/2016 Delivering MD: Janyth Contes   Date of discharge: 10/07/2016  Admitting diagnosis: 47 WEEK CTX ROM Intrauterine pregnancy: [redacted]w[redacted]d     Secondary diagnosis:  Principal Problem:   SVD (spontaneous vaginal delivery) Active Problems:   Indication for care in labor or delivery  Additional problems: none     Discharge diagnosis: Term Pregnancy Delivered                                                                                                Post partum procedures:none  Augmentation: AROM  Complications: None  Hospital course:  Onset of Labor With Vaginal Delivery     41 y.o. yo [id number] at [redacted]w[redacted]d was admitted in Active Labor on 10/05/2016. Patient had an uncomplicated labor course as follows:  Membrane Rupture Time/Date: 6:34 AM ,10/05/2016   Intrapartum Procedures: Episiotomy: None [1]                                         Lacerations:  None [1]  Patient had a delivery of a Viable infant. 10/05/2016  Information for the patient's newborn:  Luva, Metzger [[id number]]  Delivery Method: Vag-Spont    Pateint had an uncomplicated postpartum course.  She is ambulating, tolerating a regular diet, passing flatus, and urinating well. Patient is discharged home in stable condition on 10/07/16.   Physical exam  Vitals:   10/05/16 2051 10/06/16 0617 10/06/16 1910 10/07/16 0518  BP: 109/70 110/64 136/85 124/82  Pulse: 96 81 (!) 108 97  Resp: 18 18 20 18   Temp: 98.2 F (36.8 C) 97.7 F (36.5 C) 98.1 F (36.7 C) 98.4 F (36.9 C)  TempSrc: Oral Oral Oral Oral  SpO2:    99%  Weight:      Height:       General: alert, cooperative and no distress Lochia: appropriate Uterine Fundus: firm Incision: N/A DVT Evaluation: No evidence of DVT seen on physical exam. Negative Homan's sign. Labs: Lab Results  Component Value Date   WBC 10.9 (H)  10/06/2016   HGB 10.7 (L) 10/06/2016   HCT 32.4 (L) 10/06/2016   MCV 86.6 10/06/2016   PLT 104 (L) 10/06/2016   CMP Latest Ref Rng & Units 05/18/2015  Glucose 65 - 99 mg/dL 101(H)  BUN 6 - 20 mg/dL 9  Creatinine 0.44 - 1.00 mg/dL 0.63  Sodium 135 - 145 mmol/L 134(L)  Potassium 3.5 - 5.1 mmol/L 3.9  Chloride 101 - 111 mmol/L 108  CO2 22 - 32 mmol/L 23  Calcium 8.9 - 10.3 mg/dL 8.0(L)  Total Protein 6.5 - 8.1 g/dL 5.1(L)  Total Bilirubin 0.3 - 1.2 mg/dL 0.5  Alkaline Phos 38 - 126 U/L 138(H)  AST 15 - 41 U/L 26  ALT 14 - 54 U/L 10(L)    Discharge instruction: per After Visit Summary and "Baby and Me Booklet".  After visit meds:  Allergies  as of 10/07/2016      Reactions   Dimetapp C [phenylephrine-bromphen-codeine] Swelling, Other (See Comments)   Reaction:  Facial and hand swelling    Ibuprofen Other (See Comments)   Reaction:  Platelet count drops       Medication List    TAKE these medications   acetaminophen 500 MG tablet Commonly known as:  TYLENOL Take 1,000 mg by mouth every 6 (six) hours as needed for mild pain, moderate pain, fever or headache.   ibuprofen 600 MG tablet Commonly known as:  ADVIL,MOTRIN Take 1 tablet (600 mg total) by mouth every 6 (six) hours as needed for moderate pain or cramping.       Diet: routine diet  Activity: Advance as tolerated. Pelvic rest for 6 weeks.   Outpatient follow up:3 weeks and 6 weeks Follow up Appt:No future appointments. Follow up Visit:No Follow-up on file.  Postpartum contraception: Tubal Ligation  Newborn Data: Live born female  Birth Weight: 6 lb 8.4 oz (2960 g) APGAR: 8, 9  Baby Feeding: Bottle Disposition:home with mother   10/07/2016 Isaiah Serge, DO

## 2017-02-10 ENCOUNTER — Ambulatory Visit: Payer: Self-pay | Admitting: Family Medicine

## 2017-02-25 DIAGNOSIS — H6691 Otitis media, unspecified, right ear: Secondary | ICD-10-CM | POA: Diagnosis not present

## 2017-02-25 DIAGNOSIS — J111 Influenza due to unidentified influenza virus with other respiratory manifestations: Secondary | ICD-10-CM | POA: Diagnosis not present

## 2017-03-04 ENCOUNTER — Encounter: Payer: Self-pay | Admitting: Family Medicine

## 2017-03-04 ENCOUNTER — Ambulatory Visit (INDEPENDENT_AMBULATORY_CARE_PROVIDER_SITE_OTHER): Payer: Medicaid Other | Admitting: Family Medicine

## 2017-03-04 ENCOUNTER — Other Ambulatory Visit: Payer: Self-pay

## 2017-03-04 VITALS — BP 120/80 | HR 82 | Temp 98.3°F | Resp 16 | Ht 62.0 in | Wt 202.0 lb

## 2017-03-04 DIAGNOSIS — Z6836 Body mass index (BMI) 36.0-36.9, adult: Secondary | ICD-10-CM | POA: Diagnosis not present

## 2017-03-04 DIAGNOSIS — R05 Cough: Secondary | ICD-10-CM | POA: Diagnosis not present

## 2017-03-04 DIAGNOSIS — E669 Obesity, unspecified: Secondary | ICD-10-CM | POA: Diagnosis not present

## 2017-03-04 DIAGNOSIS — R059 Cough, unspecified: Secondary | ICD-10-CM
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
## 2017-03-18 ENCOUNTER — Ambulatory Visit (INDEPENDENT_AMBULATORY_CARE_PROVIDER_SITE_OTHER): Payer: Medicaid Other | Admitting: Family Medicine

## 2017-03-18 ENCOUNTER — Other Ambulatory Visit: Payer: Self-pay

## 2017-03-18 VITALS — BP 116/72 | HR 78 | Temp 98.7°F | Resp 16 | Ht 62.0 in | Wt 207.2 lb

## 2017-03-18 DIAGNOSIS — J40 Bronchitis, not specified as acute or chronic: Secondary | ICD-10-CM

## 2017-03-18 DIAGNOSIS — J320 Chronic maxillary sinusitis: Secondary | ICD-10-CM | POA: Diagnosis not present

## 2017-03-18 MED ORDER — AMOXICILLIN-POT CLAVULANATE 875-125 MG PO TABS: 1.0000 | ORAL_TABLET | Freq: Two times a day (BID) | ORAL | 0 refills | Status: DC

## 2017-03-18 MED ORDER — BENZONATATE 100 MG PO CAPS: 100.0000 mg | ORAL_CAPSULE | Freq: Three times a day (TID) | ORAL | 0 refills | Status: DC | PRN
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
## 2017-03-19 ENCOUNTER — Encounter: Payer: Self-pay | Admitting: Family Medicine

## 2017-07-02 ENCOUNTER — Encounter: Payer: Medicaid Other | Admitting: Family Medicine

## 2017-07-12 ENCOUNTER — Encounter: Payer: Medicaid Other | Admitting: Family Medicine

## 2017-08-06 ENCOUNTER — Encounter: Payer: Self-pay | Admitting: Family Medicine

## 2017-08-13 ENCOUNTER — Encounter: Payer: Medicaid Other | Admitting: Family Medicine

## 2017-10-20 ENCOUNTER — Ambulatory Visit: Payer: Medicaid Other | Admitting: Family Medicine

## 2017-11-11 ENCOUNTER — Encounter (HOSPITAL_COMMUNITY): Payer: Self-pay | Admitting: Emergency Medicine

## 2017-11-11 ENCOUNTER — Emergency Department (HOSPITAL_COMMUNITY): Payer: Medicaid Other

## 2017-11-11 ENCOUNTER — Other Ambulatory Visit: Payer: Self-pay

## 2017-11-11 ENCOUNTER — Emergency Department (HOSPITAL_COMMUNITY)
Admission: EM | Admit: 2017-11-11 | Discharge: 2017-11-11 | Disposition: A | Discharge status: Home / Self Care | Payer: Medicaid Other | Attending: Emergency Medicine | Admitting: Emergency Medicine

## 2017-11-11 DIAGNOSIS — Z79899 Other long term (current) drug therapy: Secondary | ICD-10-CM | POA: Insufficient documentation

## 2017-11-11 DIAGNOSIS — Y999 Unspecified external cause status: Secondary | ICD-10-CM | POA: Insufficient documentation

## 2017-11-11 DIAGNOSIS — S8002XA Contusion of left knee, initial encounter: Secondary | ICD-10-CM | POA: Insufficient documentation

## 2017-11-11 DIAGNOSIS — X58XXXA Exposure to other specified factors, initial encounter: Secondary | ICD-10-CM | POA: Diagnosis not present

## 2017-11-11 DIAGNOSIS — M25562 Pain in left knee: Secondary | ICD-10-CM

## 2017-11-11 DIAGNOSIS — Y929 Unspecified place or not applicable: Secondary | ICD-10-CM | POA: Diagnosis not present

## 2017-11-11 DIAGNOSIS — S8992XA Unspecified injury of left lower leg, initial encounter: Secondary | ICD-10-CM | POA: Diagnosis present

## 2017-11-11 DIAGNOSIS — Y939 Activity, unspecified: Secondary | ICD-10-CM | POA: Diagnosis not present

## 2017-11-11 NOTE — Discharge Instructions (Signed)
Avoid being on your knees.  See the Orthopaedist if pain persist

## 2017-11-11 NOTE — ED Triage Notes (Signed)
PT c/o left knee pain with no new injury x3 days. PT states hx of knee surgery in 2003.

## 2017-11-12 NOTE — ED Provider Notes (Signed)
[facility] EMERGENCY DEPARTMENT Provider Note   CSN: [id number] Arrival date & time: 11/11/17  1233     History   Chief Complaint Chief Complaint  Patient presents with  . Knee Pain    HPI Tracy Rojas is a 42 y.o. female.  The history is provided by the patient. No language interpreter was used.  Knee Pain   This is a new problem. The current episode started more than 1 week ago. The problem occurs constantly. The problem has been gradually worsening. The pain is present in the left knee. The quality of the pain is described as aching. The pain is moderate. Pertinent negatives include no numbness and full range of motion. She has tried nothing for the symptoms. The treatment provided no relief. There has been no history of extremity trauma.  Pt reports she had knee surgery in the past.  Pt complains of pain in her left knee.  Pt reports she is on her knees in the floor a lot Pt has 2 small children  Past Medical History:  Diagnosis Date  . AMA (advanced maternal age) multigravida 19+   . Bronchitis 04/2012  . GERD (gastroesophageal reflux disease)   . Headache(784.0)   . History of pre-eclampsia in prior pregnancy, currently pregnant   . Normal pregnancy in multigravida in third trimester 10/01/2016  . SVD (spontaneous vaginal delivery) 10/05/2016    Patient Active Problem List   Diagnosis Date Noted  . Indication for care in labor or delivery 10/05/2016  . SVD (spontaneous vaginal delivery) 10/05/2016  . Normal pregnancy in multigravida in third trimester 10/01/2016  . Impaired glucose tolerance in pregnancy 05/17/2015  . Advanced maternal age in multigravida 05/17/2015  . 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 Normal vaginal delivery 05/17/2015  . Mild preeclampsia 05/16/2015  . Vitamin D deficiency 09/14/2014  . Migraine without aura 12/13/2012  . Tension headache 12/13/2012  . Thrombocytopenia ([place]) 02/17/2012    Past Surgical History:  Procedure Laterality Date  .  TIBIA FRACTURE SURGERY Left 2004   rod and two screws due to trauma     OB History    Gravida  5   Para  5   Term  4   Preterm  1   AB      Living  4     SAB      TAB      Ectopic      Multiple  0   Live Births  4            Home Medications    Prior to Admission medications   Medication Sig Start Date End Date Taking? Authorizing Provider  acetaminophen (TYLENOL) 500 MG tablet Take 1,000 mg by mouth every 6 (six) hours as needed for mild pain, moderate pain, fever or headache.    [provider]  amoxicillin-clavulanate (AUGMENTIN) 875-125 MG tablet Take 1 tablet by mouth 2 (two) times daily. 03/18/17   Caren Macadam, MD  benzonatate (TESSALON) 100 MG capsule Take 1 capsule (100 mg total) by mouth 3 (three) times daily as needed for cough. 03/18/17   Caren Macadam, MD    Family History Family History  Problem Relation Age of Onset  . Hypertension Mother   . Migraines Mother        vascular migraines  . Hypertension Father   . Cancer Father        Throat    Social History Social History   Tobacco Use  . Smoking status:  Never Smoker  . Smokeless tobacco: Never Used  Substance Use Topics  . Alcohol use: No  . Drug use: No     Allergies   Dimetapp c [phenylephrine-bromphen-codeine] and Ibuprofen   Review of Systems Review of Systems  Neurological: Negative for numbness.  All other systems reviewed and are negative.    Physical Exam Updated Vital Signs BP 122/80 (BP Location: Right Arm)   Pulse 83   Temp 98.2 F (36.8 C) (Oral)   Resp 18   Ht 5\' 3"  (1.6 m)   Wt 89.4 kg   LMP 10/21/2017   SpO2 100%   BMI 34.90 kg/m   Physical Exam  Constitutional: She is oriented to person, place, and time. She appears well-developed and well-nourished.  HENT:  Head: Normocephalic.  Eyes: EOM are normal.  Neck: Normal range of motion.  Pulmonary/Chest: Effort normal.  Abdominal: She exhibits no distension.  Musculoskeletal: Normal  range of motion. She exhibits tenderness.  Bruising over left knee,  Tender bursa left knee  Neurological: She is alert and oriented to person, place, and time.  Skin: Skin is warm.  Psychiatric: She has a normal mood and affect.  Nursing note and vitals reviewed.    ED Treatments / Results  Labs (all labs ordered are listed, but only abnormal results are displayed) Labs Reviewed - No data to display  EKG None  Radiology Dg Knee Complete 4 Views Left  Result Date: 11/11/2017 CLINICAL DATA:  Pain in left knee increasing x 2 days with hx of surg. 2009 EXAM: LEFT KNEE - COMPLETE 4+ VIEW COMPARISON:  09/24/2007 FINDINGS: There is mild narrowing of the MEDIAL and patellofemoral compartments. No joint effusion. No acute fracture. Remote intramedullary nail in the tibia appears stable in appearance. IMPRESSION: No evidence for acute  abnormality. Electronically Signed   By: Nolon Nations M.D.   On: 11/11/2017 13:28    Procedures Procedures (including critical care time)  Medications Ordered in ED Medications - No data to display   Initial Impression / Assessment and Plan / ED Course  I have reviewed the triage vital signs and the nursing notes.  Pertinent labs & imaging results that were available during my care of the patient were reviewed by me and considered in my medical decision making (see chart for details).     MDM  xrays reviewed with pt.  Pt advised possible bursitis.  Pt placed in a knee sleeve for comfort   Final Clinical Impressions(s) / ED Diagnoses   Final diagnoses:  Acute pain of left knee    ED Discharge Orders    None    An After Visit Summary was printed and given to the patient.    Fransico Meadow, [place] 11/12/17 1416    Virgel Manifold, MD 11/13/17 1136

## 2017-11-17 ENCOUNTER — Encounter: Payer: Self-pay | Admitting: Orthopedic Surgery

## 2017-11-17 ENCOUNTER — Ambulatory Visit: Payer: Medicaid Other | Admitting: Family Medicine

## 2017-11-17 ENCOUNTER — Encounter: Payer: Self-pay | Admitting: Family Medicine

## 2017-11-17 ENCOUNTER — Ambulatory Visit: Payer: Medicaid Other | Admitting: Orthopedic Surgery

## 2017-11-17 ENCOUNTER — Other Ambulatory Visit: Payer: Self-pay

## 2017-11-17 ENCOUNTER — Ambulatory Visit (INDEPENDENT_AMBULATORY_CARE_PROVIDER_SITE_OTHER): Payer: Medicaid Other

## 2017-11-17 VITALS — BP 118/80 | HR 83 | Temp 98.3°F | Resp 12 | Ht 63.0 in | Wt 213.0 lb

## 2017-11-17 VITALS — BP 125/90 | HR 77 | Ht 62.0 in | Wt 213.0 lb

## 2017-11-17 DIAGNOSIS — M25562 Pain in left knee: Secondary | ICD-10-CM

## 2017-11-17 DIAGNOSIS — M79605 Pain in left leg: Secondary | ICD-10-CM

## 2017-11-17 DIAGNOSIS — S82242S Displaced spiral fracture of shaft of left tibia, sequela: Secondary | ICD-10-CM
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
## 2017-11-17 NOTE — Patient Instructions (Signed)
Use Tylenol 500 mg every 6 hours for pain  Use heat or ice as needed  Use topical medications as noted below  These are the muscle and arthrits creams I recommend:  PLEASE READ THE PACKAGE INSTRUCTIONS BEFORE USING   Ben Gay arthritis cream  Icy hot vanishing gel  Aspercreme odor free  Myoflex Oderless pain reliever  Capzasin  Sportscreme  Max freeze
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
## 2017-12-29 ENCOUNTER — Ambulatory Visit: Payer: Medicaid Other | Admitting: Orthopedic Surgery

## 2018-05-13 ENCOUNTER — Telehealth: Payer: Self-pay | Admitting: Orthopedic Surgery

## 2018-05-13 NOTE — Telephone Encounter (Signed)
Returned call to patient per voice message 05/12/18, patient asked about appointment with Dr Aline Brochure - said missed last fall. Due to insurer, aware to request referral from primary care, and that we'll be happy to schedule when received.

## 2018-07-01 ENCOUNTER — Other Ambulatory Visit: Payer: Self-pay

## 2018-07-01 ENCOUNTER — Ambulatory Visit (INDEPENDENT_AMBULATORY_CARE_PROVIDER_SITE_OTHER): Payer: Medicaid Other | Admitting: Family Medicine

## 2018-07-01 ENCOUNTER — Encounter: Payer: Self-pay | Admitting: Family Medicine

## 2018-07-01 VITALS — BP 120/81 | HR 88 | Temp 98.7°F | Resp 12 | Ht 62.0 in | Wt 221.4 lb

## 2018-07-01 DIAGNOSIS — I10 Essential (primary) hypertension: Secondary | ICD-10-CM | POA: Insufficient documentation

## 2018-07-01 DIAGNOSIS — R030 Elevated blood-pressure reading, without diagnosis of hypertension: Secondary | ICD-10-CM

## 2018-07-01 DIAGNOSIS — E781 Pure hyperglyceridemia: Secondary | ICD-10-CM

## 2018-07-01 DIAGNOSIS — M25562 Pain in left knee: Secondary | ICD-10-CM | POA: Diagnosis not present

## 2018-07-01 DIAGNOSIS — E782 Mixed hyperlipidemia: Secondary | ICD-10-CM

## 2018-07-01 DIAGNOSIS — G8929 Other chronic pain: Secondary | ICD-10-CM | POA: Insufficient documentation

## 2018-07-01 DIAGNOSIS — Z6841 Body Mass Index (BMI) 40.0 and over, adult: Secondary | ICD-10-CM | POA: Diagnosis not present

## 2018-07-01 DIAGNOSIS — L918 Other hypertrophic disorders of the skin: Secondary | ICD-10-CM | POA: Diagnosis not present

## 2018-07-01 NOTE — Patient Instructions (Addendum)
Thank you for coming into the office today. I appreciate the opportunity to provide you with the care for your health and wellness. Today we discussed: Overall health and knee pain.  It was a pleasure to meet you thank you for trusting Tracy Rojas with your care. We will bring you back in in September to reassess your labs and weight. Over the next 4 months please try to implement a diet/lifestyle changes that help you with weight control and loss.  This will help prevent you from diabetes, high blood pressure, and other factors that can be attributed to obesity.  I have attached some diets for you to review to help implement with food choices.  Work to replace your amount of sodas that you are drinking.  Drink 1 less soda a day for a week or so and then continue until you are not drinking hardly any sodas.I have also attached some information regarding exercise.  Walking is very beneficial and underrated.  We sent in a referral for you to get your knee injection with Dr. Aline Brochure.  Hopefully you will hear from them in the coming weeks.  Additionally we will bring you in the office in a few weeks to remove your skin tag.  Please continue to practice social distancing as you can to keep you your family and our community safe.  [place] YOUR HANDS WELL AND FREQUENTLY. AVOID TOUCHING YOUR FACE, UNLESS YOUR HANDS ARE FRESHLY WASHED.  GET FRESH AIR DAILY. STAY HYDRATED WITH WATER.   It was a pleasure to see you and I look forward to continuing to work together on your health and well-being. Please do not hesitate to call the office if you need care or have questions about your care.  Have a wonderful day and week. With Gratitude, Cherly Beach, DNP, AGNP-BC  [facility]  Walking is a great form of exercise to increase your strength, endurance and overall fitness.  A walking program can help you start slowly and gradually build endurance as you go.  Everyone's ability is different, so each person's  starting point will be different.  You do not have to follow them exactly.  The are just samples. You should simply find out what's right for you and stick to that program.   In the beginning, you'll start off walking 2-3 times a day for short distances.  As you get stronger, you'll be walking further at just 1-2 times per day.  A. You Can Walk For A Certain Length Of Time Each Day    Walk 5 minutes 3 times per day.  Increase 2 minutes every 2 days (3 times per day).  Work up to 25-30 minutes (1-2 times per day).   Example:   Day 1-2 5 minutes 3 times per day   Day 7-8 12 minutes 2-3 times per day   Day 13-14 25 minutes 1-2 times per day  B. You Can Walk For a Certain Distance Each Day     Distance can be substituted for time.    Example:   3 trips to mailbox (at road)   3 trips to corner of block   3 trips around the block  C. Go to local high school and use the track.    Exercising to Lose Weight Exercise is structured, repetitive physical activity to improve fitness and health. Getting regular exercise is important for everyone. It is especially important if you are overweight. Being overweight increases your risk of heart disease, stroke, diabetes, high blood  pressure, and several types of cancer. Reducing your calorie intake and exercising can help you lose weight. Exercise is usually categorized as moderate or vigorous intensity. To lose weight, most people need to do a certain amount of moderate-intensity or vigorous-intensity exercise each week. Moderate-intensity exercise  Moderate-intensity exercise is any activity that gets you moving enough to burn at least three times more energy (calories) than if you were sitting. Examples of moderate exercise include:  Walking a mile in 15 minutes.  Doing light yard work.  Biking at an easy pace. Most people should get at least 150 minutes (2 hours and 30 minutes) a week of moderate-intensity exercise to maintain their body  weight. Vigorous-intensity exercise Vigorous-intensity exercise is any activity that gets you moving enough to burn at least six times more calories than if you were sitting. When you exercise at this intensity, you should be working hard enough that you are not able to carry on a conversation. Examples of vigorous exercise include:  Running.  Playing a team sport, such as football, basketball, and soccer.  Jumping rope. Most people should get at least 75 minutes (1 hour and 15 minutes) a week of vigorous-intensity exercise to maintain their body weight. How can exercise affect me? When you exercise enough to burn more calories than you eat, you lose weight. Exercise also reduces body fat and builds muscle. The more muscle you have, the more calories you burn. Exercise also:  Improves mood.  Reduces stress and tension.  Improves your overall fitness, flexibility, and endurance.  Increases bone strength. The amount of exercise you need to lose weight depends on:  Your age.  The type of exercise.  Any health conditions you have.  Your overall physical ability. Talk to your health care provider about how much exercise you need and what types of activities are safe for you. What actions can I take to lose weight? Nutrition   Make changes to your diet as told by your health care provider or diet and nutrition specialist (dietitian). This may include: ? Eating fewer calories. ? Eating more protein. ? Eating less unhealthy fats. ? Eating a diet that includes fresh fruits and vegetables, whole grains, low-fat dairy products, and lean protein. ? Avoiding foods with added fat, salt, and sugar.  Drink plenty of water while you exercise to prevent dehydration or heat stroke. Activity  Choose an activity that you enjoy and set realistic goals. Your health care provider can help you make an exercise plan that works for you.  Exercise at a moderate or vigorous intensity most days of  the week. ? The intensity of exercise may vary from person to person. You can tell how intense a workout is for you by paying attention to your breathing and heartbeat. Most people will notice their breathing and heartbeat get faster with more intense exercise.  Do resistance training twice each week, such as: ? Push-ups. ? Sit-ups. ? Lifting weights. ? Using resistance bands.  Getting short amounts of exercise can be just as helpful as long structured periods of exercise. If you have trouble finding time to exercise, try to include exercise in your daily routine. ? Get up, stretch, and walk around every 30 minutes throughout the day. ? Go for a walk during your lunch break. ? Park your car farther away from your destination. ? If you take public transportation, get off one stop early and walk the rest of the way. ? Make phone calls while standing up and walking  around. ? Take the stairs instead of elevators or escalators.  Wear comfortable clothes and shoes with good support.  Do not exercise so much that you hurt yourself, feel dizzy, or get very short of breath. Where to find more information  [place] Department of Health and Human Services: [employer]  Centers for Disease Control and Prevention (CDC): http://www.wolf.info/ Contact a health care provider:  Before starting a new exercise program.  If you have questions or concerns about your weight.  If you have a medical problem that keeps you from exercising. Get help right away if you have any of the following while exercising:  Injury.  Dizziness.  Difficulty breathing or shortness of breath that does not go away when you stop exercising.  Chest pain.  Rapid heartbeat. Summary  Being overweight increases your risk of heart disease, stroke, diabetes, high blood pressure, and several types of cancer.  Losing weight happens when you burn more calories than you eat.  Reducing the amount of calories you eat in addition to  getting regular moderate or vigorous exercise each week helps you lose weight. This information is not intended to replace advice given to you by your health care provider. Make sure you discuss any questions you have with your health care provider. Document Released: 03/21/2010 Document Revised: 03/01/2017 Document Reviewed: 03/01/2017 Elsevier Interactive Patient Education  2019 [place] Loss Calories are units of energy. Your body needs a certain amount of calories from food to keep you going throughout the day. When you eat more calories than your body needs, your body stores the extra calories as fat. When you eat fewer calories than your body needs, your body burns fat to get the energy it needs. Calorie counting means keeping track of how many calories you eat and drink each day. Calorie counting can be helpful if you need to lose weight. If you make sure to eat fewer calories than your body needs, you should lose weight. Ask your health care provider what a healthy weight is for you. For calorie counting to work, you will need to eat the right number of calories in a day in order to lose a healthy amount of weight per week. A dietitian can help you determine how many calories you need in a day and will give you suggestions on how to reach your calorie goal.  A healthy amount of weight to lose per week is usually 1-2 lb (0.5-0.9 kg). This usually means that your daily calorie intake should be reduced by 500-750 calories.  Eating 1,200 - 1,500 calories per day can help most women lose weight.  Eating 1,500 - 1,800 calories per day can help most men lose weight.  What do I need to know about calorie counting? In order to meet your daily calorie goal, you will need to:  Find out how many calories are in each food you would like to eat. Try to do this before you eat.  Decide how much of the food you plan to eat.  Write down what you ate and how many  calories it had. Doing this is called keeping a food log. To successfully lose weight, it is important to balance calorie counting with a healthy lifestyle that includes regular activity. Aim for 150 minutes of moderate exercise (such as walking) or 75 minutes of vigorous exercise (such as running) each week. Where do I find calorie information?  The number of calories in a food can be  found on a Nutrition Facts label. If a food does not have a Nutrition Facts label, try to look up the calories online or ask your dietitian for help. Remember that calories are listed per serving. If you choose to have more than one serving of a food, you will have to multiply the calories per serving by the amount of servings you plan to eat. For example, the label on a package of bread might say that a serving size is 1 slice and that there are 90 calories in a serving. If you eat 1 slice, you will have eaten 90 calories. If you eat 2 slices, you will have eaten 180 calories. How do I keep a food log? Immediately after each meal, record the following information in your food log:  What you ate. Don't forget to include toppings, sauces, and other extras on the food.  How much you ate. This can be measured in cups, ounces, or number of items.  How many calories each food and drink had.  The total number of calories in the meal. Keep your food log near you, such as in a small notebook in your pocket, or use a mobile app or website. Some programs will calculate calories for you and show you how many calories you have left for the day to meet your goal. What are some calorie counting tips?   Use your calories on foods and drinks that will fill you up and not leave you hungry: ? Some examples of foods that fill you up are nuts and nut butters, vegetables, lean proteins, and high-fiber foods like whole grains. High-fiber foods are foods with more than 5 g fiber per serving. ? Drinks such as sodas, specialty coffee  drinks, alcohol, and juices have a lot of calories, yet do not fill you up.  Eat nutritious foods and avoid empty calories. Empty calories are calories you get from foods or beverages that do not have many vitamins or protein, such as candy, sweets, and soda. It is better to have a nutritious high-calorie food (such as an avocado) than a food with few nutrients (such as a bag of chips).  Know how many calories are in the foods you eat most often. This will help you calculate calorie counts faster.  Pay attention to calories in drinks. Low-calorie drinks include water and unsweetened drinks.  Pay attention to nutrition labels for "low fat" or "fat free" foods. These foods sometimes have the same amount of calories or more calories than the full fat versions. They also often have added sugar, starch, or salt, to make up for flavor that was removed with the fat.  Find a way of tracking calories that works for you. Get creative. Try different apps or programs if writing down calories does not work for you. What are some portion control tips?  Know how many calories are in a serving. This will help you know how many servings of a certain food you can have.  Use a measuring cup to measure serving sizes. You could also try weighing out portions on a kitchen scale. With time, you will be able to estimate serving sizes for some foods.  Take some time to put servings of different foods on your favorite plates, bowls, and cups so you know what a serving looks like.  Try not to eat straight from a bag or box. Doing this can lead to overeating. Put the amount you would like to eat in a cup or on a  plate to make sure you are eating the right portion.  Use smaller plates, glasses, and bowls to prevent overeating.  Try not to multitask (for example, watch TV or use your computer) while eating. If it is time to eat, sit down at a table and enjoy your food. This will help you to know when you are full. It will  also help you to be aware of what you are eating and how much you are eating. What are tips for following this plan? Reading food labels  Check the calorie count compared to the serving size. The serving size may be smaller than what you are used to eating.  Check the source of the calories. Make sure the food you are eating is high in vitamins and protein and low in saturated and trans fats. Shopping  Read nutrition labels while you shop. This will help you make healthy decisions before you decide to purchase your food.  Make a grocery list and stick to it. Cooking  Try to cook your favorite foods in a healthier way. For example, try baking instead of frying.  Use low-fat dairy products. Meal planning  Use more fruits and vegetables. Half of your plate should be fruits and vegetables.  Include lean proteins like poultry and fish. How do I count calories when eating out?  Ask for smaller portion sizes.  Consider sharing an entree and sides instead of getting your own entree.  If you get your own entree, eat only half. Ask for a box at the beginning of your meal and put the rest of your entree in it so you are not tempted to eat it.  If calories are listed on the menu, choose the lower calorie options.  Choose dishes that include vegetables, fruits, whole grains, low-fat dairy products, and lean protein.  Choose items that are boiled, broiled, grilled, or steamed. Stay away from items that are buttered, battered, fried, or served with cream sauce. Items labeled "crispy" are usually fried, unless stated otherwise.  Choose water, low-fat milk, unsweetened iced tea, or other drinks without added sugar. If you want an alcoholic beverage, choose a lower calorie option such as a glass of wine or light beer.  Ask for dressings, sauces, and syrups on the side. These are usually high in calories, so you should limit the amount you eat.  If you want a salad, choose a garden salad and ask  for grilled meats. Avoid extra toppings like bacon, cheese, or fried items. Ask for the dressing on the side, or ask for olive oil and vinegar or lemon to use as dressing.  Estimate how many servings of a food you are given. For example, a serving of cooked rice is  cup or about the size of half a baseball. Knowing serving sizes will help you be aware of how much food you are eating at restaurants. The list below tells you how big or small some common portion sizes are based on everyday objects: ? 1 oz--4 stacked dice. ? 3 oz--1 deck of cards. ? 1 tsp--1 die. ? 1 Tbsp-- a ping-pong ball. ? 2 Tbsp--1 ping-pong ball. ?  cup-- baseball. ? 1 cup--1 baseball. Summary  Calorie counting means keeping track of how many calories you eat and drink each day. If you eat fewer calories than your body needs, you should lose weight.  A healthy amount of weight to lose per week is usually 1-2 lb (0.5-0.9 kg). This usually means reducing your daily calorie intake  by 500-750 calories.  The number of calories in a food can be found on a Nutrition Facts label. If a food does not have a Nutrition Facts label, try to look up the calories online or ask your dietitian for help.  Use your calories on foods and drinks that will fill you up, and not on foods and drinks that will leave you hungry.  Use smaller plates, glasses, and bowls to prevent overeating. This information is not intended to replace advice given to you by your health care provider. Make sure you discuss any questions you have with your health care provider. Document Released: 02/16/2005 Document Revised: 11/05/2017 Document Reviewed: 01/17/2016 Elsevier Interactive Patient Education  2019 [employer].

## 2018-07-01 NOTE — Progress Notes (Signed)
New Patient Office Visit  Subjective:  Patient ID: Tracy Rojas, female    DOB: [birth year]  Age: 43 y.o. MRN: [id number]  CC:  Chief Complaint  Patient presents with  . New Patient (Initial Visit)    establish care    HPI JOHNIYA Rojas presents for establishment of care with me.  Not new to the clinic is well-known.  Was previously seen by Dr. Mannie Stabile.  Does not take any medications outside of Tylenol as needed.  She reports that she feels she is in good health, she has been trying to start looking at losing weight.  As she is put on more weight than she did with her children.  She is the single mother of 5 children is in a relationship with the father the last 2 children.  She works as a Quarry manager at [employer].  Reports that her mood is good.  Reports that she has strong faith and good friends and family support.  Reports that her appetite is good and she eats foods from all food groups.   Overall today she feels good outside of having some knee pain that she is previously had.Denies having any other complications in her health.   Knee pain has been ongoing for almost a year.  She has had intermittent visits to the doctor along with Ortho.  Has received cortisol injection which she said benefited her the most.  There is no mechanism of injury.  The pain is present in the left knee.  The pain is moderate to severe in nature.  The pain is been fluctuating since onset.  Associated symptoms include a development of a limp secondary to not wanting to bear weight and guard the knee with pressure.  There is no loss of range of motion sensation no numbness or tingling.  Symptoms are aggravated with movement and weightbearing.  She is tried Tylenol and and rest without relief.  Secondary to thrombocytopenia she is not to take ibuprofen.  Dr. Aline Brochure seen her previously.  And discussed her complaints of pain related to her tibial tubercle and medial which is near insertion site of a proximal  screw related to a previous surgery that she had.  X-rays at time of Dr. Ruthe Mannan appointment demonstrated healed tibia fracture.  Dr. Aline Brochure provided her with Depo-Medrol injection on 918 of 19.  She tolerated this well and reports today that this was most beneficial to her and her discomfort and pain. Additionally she was recommended to take Tylenol every 6 hours as needed for pain, heat and ice as needed.  And provided with muscle and arthritis cream recommendations for use topical application.  She needs a referral back to Ortho so that she can get another injection.   Past Medical, Surgical, Social History, Allergies, and Medications have been Reviewed.   Past Medical History:  Diagnosis Date  . AMA (advanced maternal age) multigravida 46+   . Tracy Rojas 04/2012  . GERD (gastroesophageal reflux disease)   . Tracy Rojas(784.0)   . History of Tracy Rojas in prior pregnancy, currently pregnant   . Normal pregnancy in multigravida in third trimester 10/01/2016  . SVD (spontaneous vaginal delivery) 10/05/2016    Past Surgical History:  Procedure Laterality Date  . TIBIA FRACTURE SURGERY Left 2004   rod and two screws due to trauma    Family History  Problem Relation Age of Onset  . Hypertension Mother   . Migraines Mother        vascular migraines  .  Hypertension Father   . Cancer Father        Throat    Social History   Socioeconomic History  . Marital status: Single    Spouse name: Not on file  . Number of children: 3  . Years of education: G.E. D  . Highest education level: GED or equivalent  Occupational History  . Occupation: CNA    Comment: [employer]  Social Needs  . Financial resource strain: Not hard at all  . Food insecurity:    Worry: Never true    Inability: Never true  . Transportation needs:    Medical: No    Non-medical: No  Tobacco Use  . Smoking status: Never Smoker  . Smokeless tobacco: Never Used  Substance and Sexual Activity   . Alcohol use: No  . Drug use: No  . Sexual activity: Yes    Birth control/protection: Condom, Other-see comments    Comment: pull out method  Lifestyle  . Physical activity:    Days per week: 0 days    Minutes per session: 0 min  . Stress: To some extent  Relationships  . Social connections:    Talks on phone: More than three times a week    Gets together: Once a week    Attends religious service: More than 4 times per year    Active member of club or organization: No    Attends meetings of clubs or organizations: Never    Relationship status: Never married  . Intimate partner violence:    Fear of current or ex partner: No    Emotionally abused: No    Physically abused: No    Forced sexual activity: No  Other Topics Concern  . Not on file  Social History Narrative   Patient lives at home with children.    Caffeine Use: 5-6 sodas daily   Grew up in [place].    Lived in [place] since 2014.       Has five children, vary in age from   [place] home   Tracy Rojas-3-At home   Tracy Rojas-2-At home         Family in [place].    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   Makes sure to include vegetables   Does not drink a lot of water      Wears seatbelt   Does occasionally wear sunscreen   Smoke detectors checks batteries frequently    ROS Review of Systems  HENT: Negative.   Eyes: Negative.   Respiratory: Negative.   Cardiovascular: Negative.   Gastrointestinal: Negative.   Endocrine: Negative.   Genitourinary: Negative.   Musculoskeletal: Positive for arthralgias.       See HPI   Skin: Negative.   Allergic/Immunologic: Negative.   Neurological: Negative.   Hematological: Negative.   Psychiatric/Behavioral: Negative.   All other systems reviewed and are negative.   Objective:   Today's Vitals: BP 120/81   Pulse 88   Temp 98.7 F (37.1 C) (Oral)    Resp 12   Ht 5\' 2"  (1.575 m)   Wt 221 lb 6.4 oz (100.4 kg)   LMP 06/20/2018 (Approximate)   SpO2 100%   Breastfeeding No   BMI 40.49 kg/m   Physical Exam Vitals signs and nursing note reviewed.  Constitutional:      Appearance: Normal appearance. She is obese.  HENT:  Head: Normocephalic and atraumatic.     Right Ear: Tympanic membrane, ear canal and external ear normal.     Left Ear: Tympanic membrane, ear canal and external ear normal.     Nose: Nose normal.     Mouth/Throat:     Pharynx: Oropharynx is clear.  Eyes:     General: No scleral icterus.       Right eye: No discharge.        Left eye: No discharge.     Conjunctiva/sclera: Conjunctivae normal.  Neck:     Musculoskeletal: Normal range of motion and neck supple.  Cardiovascular:     Rate and Rhythm: Normal rate and regular rhythm.     Pulses: Normal pulses.          Radial pulses are 2+ on the right side and 2+ on the left side.       Dorsalis pedis pulses are 2+ on the right side and 2+ on the left side.     Heart sounds: Normal heart sounds.  Pulmonary:     Effort: Pulmonary effort is normal.     Breath sounds: Normal breath sounds.  Abdominal:     General: Bowel sounds are normal.     Palpations: Abdomen is soft.  Musculoskeletal: Normal range of motion.     Right knee: Normal.     Left knee: She exhibits normal range of motion, no swelling, no deformity and no bony tenderness. No tenderness found.     Right lower leg: No edema.     Left lower leg: No edema.     Comments: She has demonstrated full range of motion bilaterally.  Left side tenderness over the tibial tubercle and just medial to it she is not tender over the tibial shaft or patellar tendon.  She has no pain in the patella area.  Flexion and extension are stable in the knee joint.  She has good pulses and sensation throughout and temperature.  She does not have any distal edema noted on exam.    Skin:    General: Skin is warm and dry.      Capillary Refill: Capillary refill takes less than 2 seconds.     Comments: Skin tag upper right chest-light brown (3-4 mm)  Neurological:     General: No focal deficit present.     Mental Status: She is alert and oriented to person, place, and time. Mental status is at baseline.     Cranial Nerves: Cranial nerves are intact.     Sensory: Sensation is intact.     Motor: Motor function is intact.     Gait: Gait is intact.     Deep Tendon Reflexes: Reflexes are normal and symmetric.  Psychiatric:        Attention and Perception: Attention and perception normal.        Mood and Affect: Mood and affect normal.        Speech: Speech normal.        Behavior: Behavior normal. Behavior is cooperative.        Thought Content: Thought content normal.        Cognition and Memory: Cognition and memory normal.        Judgment: Judgment normal.     Assessment & Plan:   1. Class 3 severe obesity due to excess calories without serious comorbidity with body mass index (BMI) of 40.0 to 44.9 in adult [facility]) Deteriorated, provided with weight loss plan and exercise plan today in  AVS.  Educated on the benefit of weight loss and overall health.  Encouraged to maintain a heart healthy low-fat diet.  As well as to start exercising 30 minutes on at least 5 days a week for total of 150 minutes.  Encouraged to continue to lose weight as she did have been impaired glucose tolerance test when she was pregnant.  At an obesity just placed her at higher risk for having pretension, kidney disease, diabetes along with other conditions.  - CBC with Differential/Platelet - COMPLETE METABOLIC PANEL WITH GFR - Hemoglobin A1c - Lipid panel - TSH  2. Chronic pain of left knee Deteriorated, has chronic knee pain in the left side.  Dr. Aline Brochure has seen her before.  Needs referral back to there for injection.  Referral provided today.  - Ambulatory referral to Orthopedic Surgery  3. Prehypertension Prediabetic range for  her blood pressure today in the office.  She had preeclampsia when she was pregnant.  Question as to whether or not she is well controlled overall.  We will be monitoring this in the future.  Encouraged to try to continue to lose weight.  As this will only benefit her health overall.  - CBC with Differential/Platelet - COMPLETE METABOLIC PANEL WITH GFR  4. Isolated hypertriglyceridemia Needs to have updated labs.  Previous lipid panel demonstrated elevated triglycerides.  We will be assessing this in the future as it.  Provided education on heart healthy low-fat diets.  Along with removal of fried and fatty foods, creams and sauces, and red meats. - Lipid panel  5. Skin Tag  Noted to develop a skin tag on the right upper chest.  Would like to have this removed at a future visit.  Is not giving her any trouble except for when something rubs up against it.  But overall is doing okay.  There are no signs of infection today at that visit.  Problem List Items Addressed This Visit      Other   Class 3 severe obesity due to excess calories without serious comorbidity with body mass index (BMI) of 40.0 to 44.9 in adult [facility]) - Primary   Relevant Orders   CBC with Differential/Platelet   COMPLETE METABOLIC PANEL WITH GFR   Hemoglobin A1c   Lipid panel   TSH   Chronic pain of left knee   Relevant Orders   Ambulatory referral to Orthopedic Surgery   Prehypertension   Relevant Orders   CBC with Differential/Platelet   COMPLETE METABOLIC PANEL WITH GFR   Isolated hypertriglyceridemia   Relevant Orders   Lipid panel      Outpatient Encounter Medications as of 07/01/2018  Medication Sig  . acetaminophen (TYLENOL) 500 MG tablet Take 1,000 mg by mouth every 6 (six) hours as needed for mild pain, moderate pain, fever or Tracy Rojas.   No facility-administered encounter medications on file as of 07/01/2018.     Follow-up: Return in about 4 months (around 11/01/2018) for labs one wk appt, f/u for wt  check .   Perlie Mayo, NP

## 2018-07-15 ENCOUNTER — Ambulatory Visit: Payer: Medicaid Other | Admitting: Family Medicine

## 2018-07-18 ENCOUNTER — Ambulatory Visit: Payer: Medicaid Other | Admitting: Orthopedic Surgery

## 2018-07-22 ENCOUNTER — Ambulatory Visit: Payer: Medicaid Other | Admitting: Family Medicine

## 2018-07-26 ENCOUNTER — Other Ambulatory Visit: Payer: Self-pay

## 2018-07-26 ENCOUNTER — Encounter: Payer: Self-pay | Admitting: Family Medicine

## 2018-07-26 ENCOUNTER — Ambulatory Visit: Payer: Medicaid Other | Admitting: Family Medicine

## 2018-07-26 ENCOUNTER — Ambulatory Visit (INDEPENDENT_AMBULATORY_CARE_PROVIDER_SITE_OTHER): Payer: Medicaid Other | Admitting: Family Medicine

## 2018-07-26 VITALS — BP 130/82 | HR 88 | Temp 98.6°F | Resp 12 | Ht 63.0 in | Wt 221.1 lb

## 2018-07-26 DIAGNOSIS — L918 Other hypertrophic disorders of the skin: Secondary | ICD-10-CM | POA: Diagnosis not present

## 2018-07-26 DIAGNOSIS — L989 Disorder of the skin and subcutaneous tissue, unspecified: Secondary | ICD-10-CM | POA: Diagnosis not present

## 2018-07-27 ENCOUNTER — Ambulatory Visit: Payer: Medicaid Other | Admitting: Orthopedic Surgery

## 2018-07-27 ENCOUNTER — Encounter: Payer: Self-pay | Admitting: Orthopedic Surgery

## 2018-07-27 VITALS — Resp 16 | Ht 63.0 in | Wt 222.0 lb

## 2018-07-27 DIAGNOSIS — M79605 Pain in left leg: Secondary | ICD-10-CM | POA: Diagnosis not present

## 2018-07-27 DIAGNOSIS — S82242S Displaced spiral fracture of shaft of left tibia, sequela: Secondary | ICD-10-CM | POA: Diagnosis not present

## 2018-07-27 DIAGNOSIS — D473 Essential (hemorrhagic) thrombocythemia: Secondary | ICD-10-CM | POA: Insufficient documentation

## 2018-07-27 NOTE — Progress Notes (Signed)
Chief Complaint  Patient presents with  . Knee Pain    left/ pain comes and goes   . Medication Refill    Ibuprofen helps but made platelets drop, wants to know if she can use it now.     43 year old female had a left tibial nailing had subsequent dynamization with distal screw removal has chronic anterior knee pain usually relieved with ibuprofen.  However, she had thrombocytopenia thought to be related to ibuprofen.  She says at one point she was taking it every day like candy for her migrains.  Hematologist recommended stopping the ibuprofen.  Tylenol does not seem to relieve the pain as well    Review of Systems  Musculoskeletal: Positive for joint pain.  Neurological: Negative.     Resp 16   Ht 5\' 3"  (1.6 m)   Wt 222 lb (100.7 kg)   BMI 39.33 kg/m  Physical Exam Vitals signs and nursing note reviewed.  Constitutional:      Appearance: Normal appearance.  Musculoskeletal:     Left knee: She exhibits no swelling, no LCL laxity and no MCL laxity. No tenderness found.       Legs:  Neurological:     Mental Status: She is alert and oriented to person, place, and time.  Psychiatric:        Mood and Affect: Mood normal.    Assessment and plan  Common postoperative complication of tibial nailing with anterior knee pain.  Removing the nail has not been shown to improve the symptoms and more than 50% of the patients.  The patient will take ibuprofen on an as-needed basis once or twice a day when her knee is hurting and otherwise not take it.  Follow-up with [place] on an as-needed basis

## 2018-07-28 ENCOUNTER — Encounter: Payer: Self-pay | Admitting: Family Medicine

## 2018-07-28 NOTE — Progress Notes (Signed)
Subjective:     Patient ID: Tracy Rojas, female   DOB: 1975/09/07, 43 y.o.   MRN: [id number]  DUNYA MEINERS presents for skin tag removal  Ms. Repsher presents today for removal of the skin lesion/tag. Noted on previous exam: Right upper chest, was becoming painful and irritated by her bra strap.  Which is why she opted for removal.  Today she has no signs or symptoms of infection which include cough, shortness of breath, fevers, chills.  She denies having any chest pain or chest tightness.  She denies have any headaches or dizziness.  Overall she is doing well she has no issues or no other trouble to report.  Past Medical, Surgical, Social History, Allergies, and Medications have been Reviewed.   Past Medical History:  Diagnosis Date  . AMA (advanced maternal age) multigravida 26+   . Bronchitis 04/2012  . GERD (gastroesophageal reflux disease)   . Headache(784.0)   . History of pre-eclampsia in prior pregnancy, currently pregnant   . Normal pregnancy in multigravida in third trimester 10/01/2016  . SVD (spontaneous vaginal delivery) 10/05/2016   Past Surgical History:  Procedure Laterality Date  . TIBIA FRACTURE SURGERY Left 2004   rod and two screws due to trauma   Social History   Socioeconomic History  . Marital status: Single    Spouse name: Not on file  . Number of children: 3  . Years of education: G.E. D  . Highest education level: GED or equivalent  Occupational History  . Occupation: CNA    Comment: [employer]  Social Needs  . Financial resource strain: Not hard at all  . Food insecurity:    Worry: Never true    Inability: Never true  . Transportation needs:    Medical: No    Non-medical: No  Tobacco Use  . Smoking status: Never Smoker  . Smokeless tobacco: Never Used  Substance and Sexual Activity  . Alcohol use: No  . Drug use: No  . Sexual activity: Yes    Birth control/protection: Condom, Other-see comments   Comment: pull out method  Lifestyle  . Physical activity:    Days per week: 0 days    Minutes per session: 0 min  . Stress: To some extent  Relationships  . Social connections:    Talks on phone: More than three times a week    Gets together: Once a week    Attends religious service: More than 4 times per year    Active member of club or organization: No    Attends meetings of clubs or organizations: Never    Relationship status: Never married  . Intimate partner violence:    Fear of current or ex partner: No    Emotionally abused: No    Physically abused: No    Forced sexual activity: No  Other Topics Concern  . Not on file  Social History Narrative   Patient lives at home with children.    Caffeine Use: 5-6 sodas daily   Grew up in [place].    Lived in [place] since 2014.       Has five children, vary in age from   [place] home   Emma-3-At home   Braelynn-2-At home         Family in [place].    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   Makes sure  to include vegetables   Does not drink a lot of water      Wears seatbelt   Does occasionally wear sunscreen   Smoke detectors checks batteries frequently    Outpatient Encounter Medications as of 07/26/2018  Medication Sig  . acetaminophen (TYLENOL) 500 MG tablet Take 1,000 mg by mouth every 6 (six) hours as needed for mild pain, moderate pain, fever or headache.   No facility-administered encounter medications on file as of 07/26/2018.    Allergies  Allergen Reactions  . Dimetapp C [Phenylephrine-Bromphen-Codeine] Swelling and Other (See Comments)    Reaction:  Facial and hand swelling   . Ibuprofen Other (See Comments)    Reaction:  Platelet count drops     Review of Systems  Constitutional: Negative.   HENT: Negative.   Eyes: Negative.   Respiratory: Negative.   Cardiovascular: Negative.    Gastrointestinal: Negative.   Endocrine: Negative.   Genitourinary: Negative.   Musculoskeletal: Negative.   Skin:       See HPI  Allergic/Immunologic: Negative.   Neurological: Negative.   Hematological: Negative.   Psychiatric/Behavioral: Negative.   All other systems reviewed and are negative.      Objective:     BP 130/82   Pulse 88   Temp 98.6 F (37 C) (Oral)   Resp 12   Ht 5\' 3"  (1.6 m)   Wt 221 lb 1.3 oz (100.3 kg)   SpO2 98%   BMI 39.16 kg/m   Physical Exam Vitals signs reviewed.  Constitutional:      Appearance: Normal appearance. She is obese.  HENT:     Head: Normocephalic.     Nose: Nose normal.     Mouth/Throat:     Mouth: Mucous membranes are moist.     Pharynx: Oropharynx is clear.  Eyes:     Conjunctiva/sclera: Conjunctivae normal.  Neck:     Musculoskeletal: Normal range of motion and neck supple.  Cardiovascular:     Rate and Rhythm: Normal rate and regular rhythm.     Pulses: Normal pulses.     Heart sounds: Normal heart sounds.  Pulmonary:     Effort: Pulmonary effort is normal.     Breath sounds: Normal breath sounds.  Musculoskeletal: Normal range of motion.  Skin:    General: Skin is warm and dry.     Comments: Skin tag upper right chest-light brown (3-4 mm)   Neurological:     Mental Status: She is alert and oriented to person, place, and time.  Psychiatric:        Mood and Affect: Mood normal.        Behavior: Behavior normal.        Thought Content: Thought content normal.        Judgment: Judgment normal.    Procedure: Removal of skin lesion/tag. Due to the size of the skin tag patient opted for it to be removed without numbing medication. Chaperone present: Abby Wingate, CMA Site was right upper chest: Cleaned with alcohol and iodine solution Skin tag was then held with pressure and sterile scalpel was used to remove the stalk. Light bleeding was noted after removal of the stalk and skin tag.  Antibiotic ointment and  gauze were applied and pressure was held for approximately 5 minutes. New dressing was applied as patient opted for no cauterization in the office for instant relief of bleeding. Patient tolerated procedure very well.     Assessment and Plan  1. Encounter for removal of skin lesion See documentation in subjective, procedural area. Provided education on how to keep the site and area clean.  2. Skin tag Resolved, removed today during office visit   Follow-up: As needed and next appointment is 11/04/2018       Perlie Mayo, DNP, AGNP-BC [address], [address] [place], [place] [address] Office Hours: Mon-Thurs 8 am-5 pm; Fri 8 am-12 pm Office Phone:  [phone]  Office Fax: [phone]

## 2018-07-28 NOTE — Patient Instructions (Signed)
    Thank you for coming into the office today. I appreciate the opportunity to provide you with the care for your health and wellness. Today we discussed: Skin tag removal  Please make sure that you are keeping your skin tag removal site clean.  By dressing it with antibiotic ointment and bandage as needed.  Once a scab has formed you should not have any other issues or concerns with this.  Please allow the scab and skin to heal slowly on its own.  Please do not pick at it.  Please let us know if you have any signs or symptoms of infection which include redness, swelling, ongoing bleeding and drainage, and warmth.  [place] YOUR HANDS WELL AND FREQUENTLY. AVOID TOUCHING YOUR FACE, UNLESS YOUR HANDS ARE FRESHLY WASHED.  GET FRESH AIR DAILY. STAY HYDRATED WITH WATER.   It was a pleasure to see you and I look forward to continuing to work together on your health and well-being. Please do not hesitate to call the office if you need care or have questions about your care.  Have a wonderful day and week. With Gratitude, Cherly [place], DNP, AGNP-BC

## 2018-08-02 ENCOUNTER — Ambulatory Visit: Payer: Medicaid Other | Admitting: Family Medicine

## 2018-09-06 ENCOUNTER — Other Ambulatory Visit (HOSPITAL_COMMUNITY)
Admission: RE | Admit: 2018-09-06 | Discharge: 2018-09-06 | Disposition: A | Discharge status: Home / Self Care | Payer: Medicaid Other | Source: Ambulatory Visit | Attending: Family Medicine | Admitting: Family Medicine

## 2018-09-06 ENCOUNTER — Encounter: Payer: Self-pay | Admitting: Family Medicine

## 2018-09-06 ENCOUNTER — Other Ambulatory Visit: Payer: Self-pay

## 2018-09-06 ENCOUNTER — Telehealth: Payer: Self-pay

## 2018-09-06 ENCOUNTER — Ambulatory Visit (INDEPENDENT_AMBULATORY_CARE_PROVIDER_SITE_OTHER): Payer: Medicaid Other | Admitting: Family Medicine

## 2018-09-06 VITALS — BP 120/78 | HR 108 | Temp 99.1°F | Ht 62.0 in | Wt 218.1 lb

## 2018-09-06 DIAGNOSIS — R059 Cough, unspecified: Secondary | ICD-10-CM

## 2018-09-06 DIAGNOSIS — N3001 Acute cystitis with hematuria: Secondary | ICD-10-CM | POA: Diagnosis not present

## 2018-09-06 DIAGNOSIS — R05 Cough: Secondary | ICD-10-CM | POA: Diagnosis not present

## 2018-09-06 DIAGNOSIS — T7840XA Allergy, unspecified, initial encounter: Secondary | ICD-10-CM

## 2018-09-06 DIAGNOSIS — R300 Dysuria: Secondary | ICD-10-CM | POA: Insufficient documentation

## 2018-09-06 MED ORDER — FLUTICASONE PROPIONATE 50 MCG/ACT NA SUSP
2.0000 | Freq: Every day | NASAL | 6 refills | Status: DC
Start: 2018-09-06 — End: 2019-06-20

## 2018-09-06 MED ORDER — SULFAMETHOXAZOLE-TRIMETHOPRIM 800-160 MG PO TABS: 1.0000 | ORAL_TABLET | Freq: Two times a day (BID) | ORAL | 0 refills | Status: AC

## 2018-09-06 NOTE — Progress Notes (Signed)
Subjective:     Patient ID: Tracy Rojas, female   DOB: 10-Oct-1975, 43 y.o.   MRN: [id number]  Tracy Rojas presents for Cough (x's 1 day), Urinary Frequency (x's 3 days), and Back Pain (x's 3 days)  Tracy Rojas is a 43 year old female patient of mine.  Who presents today after having a cough that onset last night, urinary frequency x3 days with some burning.  And some back pain for 3 days.  Reports that these are the signs and symptoms that she usually has when she has a UTI.  Additionally she may have a yeast infection as well.  She reports that she thinks her cough is related to allergies as she usually has allergies when it is more humid and there is more chance of mold.  She denies having any exposure to COVID.  She denies having any shortness of breath, cough, any other signs of symptoms that have been seeing her related to COVID at this time.  Today patient denies signs and symptoms of COVID 19 infection including fever, chills, shortness of breath, and headache.  Past Medical, Surgical, Social History, Allergies, and Medications have been Reviewed.    Past Medical History:  Diagnosis Date  . AMA (advanced maternal age) multigravida 66+   . Bronchitis 04/2012  . GERD (gastroesophageal reflux disease)   . Headache(784.0)   . History of pre-eclampsia in prior pregnancy, currently pregnant   . Normal pregnancy in multigravida in third trimester 10/01/2016  . SVD (spontaneous vaginal delivery) 10/05/2016   Past Surgical History:  Procedure Laterality Date  . TIBIA FRACTURE SURGERY Left 2004   rod and two screws due to trauma   Social History   Socioeconomic History  . Marital status: Single    Spouse name: Not on file  . Number of children: 3  . Years of education: G.E. D  . Highest education level: GED or equivalent  Occupational History  . Occupation: CNA    Comment: [employer]  Social Needs  . Financial resource strain: Not hard at all  . Food  insecurity    Worry: Never true    Inability: Never true  . Transportation needs    Medical: No    Non-medical: No  Tobacco Use  . Smoking status: Never Smoker  . Smokeless tobacco: Never Used  Substance and Sexual Activity  . Alcohol use: No  . Drug use: No  . Sexual activity: Yes    Birth control/protection: Condom, Other-see comments    Comment: pull out method  Lifestyle  . Physical activity    Days per week: 0 days    Minutes per session: 0 min  . Stress: To some extent  Relationships  . Social connections    Talks on phone: More than three times a week    Gets together: Once a week    Attends religious service: More than 4 times per year    Active member of club or organization: No    Attends meetings of clubs or organizations: Never    Relationship status: Never married  . Intimate partner violence    Fear of current or ex partner: No    Emotionally abused: No    Physically abused: No    Forced sexual activity: No  Other Topics Concern  . Not on file  Social History Narrative   Patient lives at home with children.    Caffeine Use: 5-6 sodas daily   Grew up in [place].  Lived in [place] since 2014.       Has five children, vary in age from   [place] home   Emma-3-At home   Braelynn-2-At home         Family in [place].    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   Makes sure to include vegetables   Does not drink a lot of water      Wears seatbelt   Does occasionally wear sunscreen   Smoke detectors checks batteries frequently    Outpatient Encounter Medications as of 09/06/2018  Medication Sig  . acetaminophen (TYLENOL) 500 MG tablet Take 1,000 mg by mouth every 6 (six) hours as needed for mild pain, moderate pain, fever or headache.   No facility-administered encounter medications on file as of 09/06/2018.    Allergies  Allergen Reactions   . Dimetapp C [Phenylephrine-Bromphen-Codeine] Swelling and Other (See Comments)    Reaction:  Facial and hand swelling   . Ibuprofen Other (See Comments)    Reaction:  Platelet count drops     Review of Systems  Constitutional: Negative for activity change, appetite change, chills and fever.  HENT: Positive for congestion, sinus pressure and sinus pain.   Eyes: Negative.   Respiratory: Negative for cough and shortness of breath.   Cardiovascular: Negative.   Gastrointestinal: Negative.   Endocrine: Negative.   Genitourinary: Positive for dysuria, flank pain, frequency and urgency.  Skin: Negative.   Allergic/Immunologic: Positive for environmental allergies.  Neurological: Negative.   Psychiatric/Behavioral: Negative.   All other systems reviewed and are negative.      Objective:     BP 120/78 (BP Location: Left Arm, Patient Position: Sitting, Cuff Size: Normal)   Pulse (!) 108   Temp 99.1 F (37.3 C) (Oral)   Ht 5\' 2"  (1.575 m)   Wt 218 lb 1.9 oz (98.9 kg)   SpO2 97%   BMI 39.89 kg/m   Physical Exam Vitals signs and nursing note reviewed.  Constitutional:      Appearance: Normal appearance. She is obese.  HENT:     Head: Normocephalic and atraumatic.     Right Ear: Tympanic membrane, ear canal and external ear normal. There is no impacted cerumen.     Left Ear: Tympanic membrane and external ear normal. There is no impacted cerumen.     Nose: Nose normal. No congestion or rhinorrhea.     Mouth/Throat:     Pharynx: Oropharynx is clear.  Eyes:     General:        Right eye: No discharge.        Left eye: No discharge.     Conjunctiva/sclera: Conjunctivae normal.  Neck:     Musculoskeletal: Normal range of motion and neck supple.  Cardiovascular:     Rate and Rhythm: Regular rhythm. Tachycardia present.     Pulses: Normal pulses.     Heart sounds: Normal heart sounds.  Pulmonary:     Effort: Pulmonary effort is normal.     Breath sounds: Normal breath  sounds.  Musculoskeletal: Normal range of motion.  Skin:    General: Skin is warm.     Capillary Refill: Capillary refill takes less than 2 seconds.  Neurological:     Mental Status: She is alert and oriented to person, place, and time.  Psychiatric:        Mood and Affect: Mood normal.  Behavior: Behavior normal.        Thought Content: Thought content normal.        Judgment: Judgment normal.        Assessment and Plan        1. Acute cystitis with hematuria + UA dip in office, will treat. Additional low-grade fever. Additionally send out for cx and sensitivities. If med changed needed will do so. Advised on UTI treatment and care.   Reviewed side effects, risks and benefits of medication.   Patient acknowledged agreement and understanding of the plan.   - POCT URINALYSIS DIP (CLINITEK) - Urinalysis w microscopic + reflex cultur - sulfamethoxazole-trimethoprim (BACTRIM DS) 800-160 MG tablet; Take 1 tablet by mouth 2 (two) times daily for 3 days.  Dispense: 6 tablet; Refill: 0  2. Allergic state, initial encounter Reports hx of allergies especially with humidity/mold times. Pressure but no drainage. Will try Flonase, also recommend OTC claritin or alike for daily use.  - fluticasone (FLONASE) 50 MCG/ACT nasal spray; Place 2 sprays into both nostrils daily.  Dispense: 16 g; Refill: 6  3. Cough Could be allergy related, will try allergies and if not better consider other causes. No exposure per her.  Does have low-grade fever.  But with history of having allergies around this time of the year will defer to that for now.  Educated on worsening signs and symptoms and if she should go to the emergency room if they were to worsen.  Patient acknowledged agreement and understanding of the plan.   Follow Up: as needed  Perlie Mayo, DNP, AGNP-BC [place], [address] [place], [place] [address] Office Hours:  Mon-Thurs 8 am-5 pm; Fri 8 am-12 pm Office Phone:  [phone]  Office Fax: [phone]

## 2018-09-06 NOTE — Patient Instructions (Signed)
Thank you for coming into the office today. I appreciate the opportunity to provide you with the care for your health and wellness. Today we discussed: UTI and cough/allegies  Follow Up: as needed   Call By Monday if not better. Also can message via MyChart overweekend.  If your fever, cough, or symptoms worsen please go to nearest ED. IF you develop Shortness of breath please go to the nearest ED.  TAKE FULL COURSE OF MEDICATION FOR UTI. HYDRATE WITH WATER AND CRANBERRY JUICE IS WOULD LIKE.  Use Flonase as directed and can get over the counter Claritin or zyrtec to take once daily as well.  Please continue to practice social distancing to keep you, your family, and our community safe.  If you must go out, please wear a Mask and practice good handwashing.  Avinger YOUR HANDS WELL AND FREQUENTLY. AVOID TOUCHING YOUR FACE, UNLESS YOUR HANDS ARE FRESHLY WASHED.  GET FRESH AIR DAILY. STAY HYDRATED WITH WATER.   It was a pleasure to see you and I look forward to continuing to work together on your health and well-being. Please do not hesitate to call the office if you need care or have questions about your care.  Have a wonderful day and week.  With Gratitude,  Cherly Beach, DNP, AGNP-BC   Allergies can cause a lot of symptoms: watery, itching eyes, runny nose (clear), sneezing, sinus pressure, and headaches. This is not making you contagious to others. You can not spread to others or catch this from others. These symptoms happen after you have been exposed to something that you are allergic to an allergen. Prevention: The best prevention is to avoid the things that you know you are allergic to, for example smoke (cigarette, cigar, wood); pollens and molds; animal dander; dust mites. And indoor inhalants such as cleaning products or aerosol sprays.  Target your bedroom as allergy free by removing carpets, damp mopping floors weekly, hanging washable curtains instead of blinds,  removing books and stuffed animals, using foam pillows, and encasing pillows and mattress in plastic. Do not blow your nose too frequently or too hard. It may cause your eardrum to perforate (tear). Blow through both nostrils at the same time to equalize pressure.  Use tissue when you blow your nose. Dispose of them and then wash your hands. If no tissue is available, do the "elbow sneeze" into the bend of your arm (away from your open hands). Always wash your hands.  If able use the [facility] in the house and car to reduce exposure to pollens. Use an air filtration system in your house or buy a small one for your bedroom. Dust your house often, using a cloth and cleaner or polish that keeps the dust from flying into the air. Allergy testing can be done if you have had allergies for a long time and are not doing well on current treatments. Take your medications as directed. If you find the medications are not working let your healthcare provider know. It might take more than one medication to control allergies, especially, seasonal ones.     Urinary Tract Infection, Adult  A urinary tract infection (UTI) is an infection of any part of the urinary tract. The urinary tract includes the kidneys, ureters, bladder, and urethra. These organs make, store, and get rid of urine in the body. Your health care provider may use other names to describe the infection. An upper UTI affects the ureters and kidneys (pyelonephritis). A lower UTI affects  the bladder (cystitis) and urethra (urethritis). What are the causes? Most urinary tract infections are caused by bacteria in your genital area, around the entrance to your urinary tract (urethra). These bacteria grow and cause inflammation of your urinary tract. What increases the risk? You are more likely to develop this condition if:  You have a urinary catheter that stays in place (indwelling).  You are not able to control when you urinate or have a bowel movement (you  have incontinence).  You are female and you: ? Use a spermicide or diaphragm for birth control. ? Have low estrogen levels. ? Are pregnant.  You have certain genes that increase your risk (genetics).  You are sexually active.  You take antibiotic medicines.  You have a condition that causes your flow of urine to slow down, such as: ? An enlarged prostate, if you are female. ? Blockage in your urethra (stricture). ? A kidney stone. ? A nerve condition that affects your bladder control (neurogenic bladder). ? Not getting enough to drink, or not urinating often.  You have certain medical conditions, such as: ? Diabetes. ? A weak disease-fighting system (immunesystem). ? Sickle cell disease. ? Gout. ? Spinal cord injury. What are the signs or symptoms? Symptoms of this condition include:  Needing to urinate right away (urgently).  Frequent urination or passing small amounts of urine frequently.  Pain or burning with urination.  Blood in the urine.  Urine that smells bad or unusual.  Trouble urinating.  Cloudy urine.  Vaginal discharge, if you are female.  Pain in the abdomen or the lower back. You may also have:  Vomiting or a decreased appetite.  Confusion.  Irritability or tiredness.  A fever.  Diarrhea. The first symptom in older adults may be confusion. In some cases, they may not have any symptoms until the infection has worsened. How is this diagnosed? This condition is diagnosed based on your medical history and a physical exam. You may also have other tests, including:  Urine tests.  Blood tests.  Tests for sexually transmitted infections (STIs). If you have had more than one UTI, a cystoscopy or imaging studies may be done to determine the cause of the infections. How is this treated? Treatment for this condition includes:  Antibiotic medicine.  Over-the-counter medicines to treat discomfort.  Drinking enough water to stay hydrated. If you  have frequent infections or have other conditions such as a kidney stone, you may need to see a health care provider who specializes in the urinary tract (urologist). In rare cases, urinary tract infections can cause sepsis. Sepsis is a life-threatening condition that occurs when the body responds to an infection. Sepsis is treated in the hospital with IV antibiotics, fluids, and other medicines. Follow these instructions at home:  Medicines  Take over-the-counter and prescription medicines only as told by your health care provider.  If you were prescribed an antibiotic medicine, take it as told by your health care provider. Do not stop using the antibiotic even if you start to feel better. General instructions  Make sure you: ? Empty your bladder often and completely. Do not hold urine for long periods of time. ? Empty your bladder after sex. ? Wipe from front to back after a bowel movement if you are female. Use each tissue one time when you wipe.  Drink enough fluid to keep your urine pale yellow.  Keep all follow-up visits as told by your health care provider. This is important. Contact a health  care provider if:  Your symptoms do not get better after 1-2 days.  Your symptoms go away and then return. Get help right away if you have:  Severe pain in your back or your lower abdomen.  A fever.  Nausea or vomiting. Summary  A urinary tract infection (UTI) is an infection of any part of the urinary tract, which includes the kidneys, ureters, bladder, and urethra.  Most urinary tract infections are caused by bacteria in your genital area, around the entrance to your urinary tract (urethra).  Treatment for this condition often includes antibiotic medicines.  If you were prescribed an antibiotic medicine, take it as told by your health care provider. Do not stop using the antibiotic even if you start to feel better.  Keep all follow-up visits as told by your health care provider.  This is important. This information is not intended to replace advice given to you by your health care provider. Make sure you discuss any questions you have with your health care provider. Document Released: 11/26/2004 Document Revised: 02/03/2018 Document Reviewed: 08/26/2017 Elsevier Patient Education  2020 [employer].

## 2018-09-06 NOTE — Telephone Encounter (Signed)
LeighAnn Camerin Ladouceur, CMA  

## 2018-09-07 ENCOUNTER — Other Ambulatory Visit (HOSPITAL_COMMUNITY)
Admission: RE | Admit: 2018-09-07 | Discharge: 2018-09-07 | Disposition: A | Discharge status: Home / Self Care | Payer: Medicaid Other | Source: Other Acute Inpatient Hospital | Attending: Family Medicine | Admitting: Family Medicine

## 2018-09-07 DIAGNOSIS — R39198 Other difficulties with micturition: Secondary | ICD-10-CM | POA: Insufficient documentation

## 2018-09-07 LAB — POCT URINALYSIS DIP (CLINITEK)
Bilirubin, UA: NEGATIVE
Glucose, UA: NEGATIVE mg/dL
Nitrite, UA: NEGATIVE
POC PROTEIN,UA: NEGATIVE
Spec Grav, UA: 1.02 (ref 1.010–1.025)
Urobilinogen, UA: 0.2 E.U./dL
pH, UA: 5 (ref 5.0–8.0)

## 2018-09-07 LAB — URINALYSIS, COMPLETE (UACMP) WITH MICROSCOPIC
Bacteria, UA: NONE SEEN
Bilirubin Urine: NEGATIVE
Glucose, UA: NEGATIVE mg/dL
Hgb urine dipstick: NEGATIVE
Ketones, ur: NEGATIVE mg/dL
Nitrite: NEGATIVE
Protein, ur: NEGATIVE mg/dL
Specific Gravity, Urine: 1.025 (ref 1.005–1.030)
WBC, UA: >50 WBC/hpf — ABNORMAL HIGH (ref 0–5)
pH: 5 (ref 5.0–8.0)

## 2018-09-08 LAB — URINE CULTURE

## 2018-09-08 LAB — URINE CYTOLOGY ANCILLARY ONLY: Candida vaginitis: NEGATIVE

## 2018-09-08 NOTE — Progress Notes (Signed)
No isolated bacteria. Let us know if not feeling better. Otherwise continue to practice good hygiene and drink water, ans void when you need to.

## 2018-09-10 ENCOUNTER — Encounter: Payer: Self-pay | Admitting: Family Medicine

## 2018-09-13 ENCOUNTER — Other Ambulatory Visit: Payer: Self-pay

## 2018-09-13 ENCOUNTER — Ambulatory Visit (INDEPENDENT_AMBULATORY_CARE_PROVIDER_SITE_OTHER): Payer: Medicaid Other | Admitting: Family Medicine

## 2018-09-13 VITALS — BP 120/78 | HR 102 | Temp 98.9°F | Resp 15 | Ht 62.0 in | Wt 218.0 lb

## 2018-09-13 DIAGNOSIS — J320 Chronic maxillary sinusitis: Secondary | ICD-10-CM

## 2018-09-13 DIAGNOSIS — R05 Cough: Secondary | ICD-10-CM

## 2018-09-13 DIAGNOSIS — R059 Cough, unspecified: Secondary | ICD-10-CM

## 2018-09-13 MED ORDER — AMOXICILLIN-POT CLAVULANATE 875-125 MG PO TABS: 1.0000 | ORAL_TABLET | Freq: Two times a day (BID) | ORAL | 0 refills | Status: AC

## 2018-09-13 NOTE — Progress Notes (Signed)
Subjective:     Patient ID: Tracy Rojas, female   DOB: May 24, 1975, 43 y.o.   MRN: [id number]  KARLEI Rojas presents for ongoing sinus/allergies. Was just seen back on 7 July for questionable allergies as well.  Reports that these worsened over the weekend. Today she reports increasing cough.  Intermittent low-grade fevers.  Does not feel like she has any shortness of breath.  But there is noted shortness of breath in conversation.  Has facial pressure and sinus pressure denies having any ear pain or thraot pain. Cough is troublesome and worse symptom today.  She reports that Flonase has not helped.  Previous office note: She reports that she thinks her cough is related to allergies as she usually has allergies when it is more humid and there is more chance of mold.  She denies having any exposure to COVID.  She denies having any shortness of breath, cough, any other signs of symptoms that have been seeing her related to COVID at this time. I started her on Flonase at that time.    Today patient denies signs and symptoms of COVID 19 infection including chills, shortness of breath, and headache.  Past Medical, Surgical, Social History, Allergies, and Medications have been Reviewed.   Past Medical History:  Diagnosis Date  . AMA (advanced maternal age) multigravida 57+   . Bronchitis 04/2012  . GERD (gastroesophageal reflux disease)   . Headache(784.0)   . History of pre-eclampsia in prior pregnancy, currently pregnant   . Normal pregnancy in multigravida in third trimester 10/01/2016  . SVD (spontaneous vaginal delivery) 10/05/2016   Past Surgical History:  Procedure Laterality Date  . TIBIA FRACTURE SURGERY Left 2004   rod and two screws due to trauma   Social History   Socioeconomic History  . Marital status: Single    Spouse name: Not on file  . Number of children: 3  . Years of education: G.E. D  . Highest education level: GED or equivalent  Occupational  History  . Occupation: CNA    Comment: [employer]  Social Needs  . Financial resource strain: Not hard at all  . Food insecurity    Worry: Never true    Inability: Never true  . Transportation needs    Medical: No    Non-medical: No  Tobacco Use  . Smoking status: Never Smoker  . Smokeless tobacco: Never Used  Substance and Sexual Activity  . Alcohol use: No  . Drug use: No  . Sexual activity: Yes    Birth control/protection: Condom, Other-see comments    Comment: pull out method  Lifestyle  . Physical activity    Days per week: 0 days    Minutes per session: 0 min  . Stress: To some extent  Relationships  . Social connections    Talks on phone: More than three times a week    Gets together: Once a week    Attends religious service: More than 4 times per year    Active member of club or organization: No    Attends meetings of clubs or organizations: Never    Relationship status: Never married  . Intimate partner violence    Fear of current or ex partner: No    Emotionally abused: No    Physically abused: No    Forced sexual activity: No  Other Topics Concern  . Not on file  Social History Narrative   Patient lives at home with children.  Caffeine Use: 5-6 sodas daily   Grew up in [place].    Lived in [place] since 2014.       Has five children, vary in age from   [place] home   Emma-3-At home   Braelynn-2-At home         Family in [place].    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   Makes sure to include vegetables   Does not drink a lot of water      Wears seatbelt   Does occasionally wear sunscreen   Smoke detectors checks batteries frequently    Outpatient Encounter Medications as of 09/13/2018  Medication Sig  . acetaminophen (TYLENOL) 500 MG tablet Take 1,000 mg by mouth every 6 (six) hours as needed for mild pain,  moderate pain, fever or headache.  . fluticasone (FLONASE) 50 MCG/ACT nasal spray Place 2 sprays into both nostrils daily.   No facility-administered encounter medications on file as of 09/13/2018.    Allergies  Allergen Reactions  . Dimetapp C [Phenylephrine-Bromphen-Codeine] Swelling and Other (See Comments)    Reaction:  Facial and hand swelling   . Ibuprofen Other (See Comments)    Reaction:  Platelet count drops     Review of Systems  Constitutional: Negative for activity change, appetite change, chills and fever.  HENT: Positive for congestion, sinus pressure and sinus pain. Negative for ear discharge and ear pain.   Eyes: Negative for visual disturbance.  Respiratory: Positive for cough. Negative for shortness of breath.   Cardiovascular: Negative.   Gastrointestinal: Negative.   Endocrine: Negative.   Genitourinary: Negative.   Musculoskeletal: Negative.   Allergic/Immunologic: Negative.   Neurological: Negative for dizziness and headaches.  All other systems reviewed and are negative.      Objective:     There were no vitals taken for this visit.  Physical Exam Vitals signs and nursing note reviewed.  Constitutional:      Appearance: Normal appearance. She is obese.  HENT:     Head: Normocephalic and atraumatic.     Right Ear: External ear normal.     Left Ear: External ear normal.     Nose: Congestion present.     Right Sinus: Maxillary sinus tenderness and frontal sinus tenderness present.     Left Sinus: Maxillary sinus tenderness and frontal sinus tenderness present.     Mouth/Throat:     Pharynx: No posterior oropharyngeal erythema.  Eyes:     General:        Left eye: No discharge.     Conjunctiva/sclera: Conjunctivae normal.  Neck:     Musculoskeletal: Normal range of motion and neck supple.  Cardiovascular:     Rate and Rhythm: Regular rhythm. Tachycardia present.     Pulses: Normal pulses.     Heart sounds: Normal heart sounds.  Pulmonary:      Effort: Pulmonary effort is normal.     Breath sounds: Normal breath sounds.  Musculoskeletal: Normal range of motion.  Skin:    General: Skin is warm.     Capillary Refill: Capillary refill takes less than 2 seconds.  Neurological:     Mental Status: She is alert and oriented to person, place, and time.  Psychiatric:        Mood and Affect: Mood normal.        Behavior: Behavior normal.        Judgment:  Judgment normal.        Assessment and Plan       1. Maxillary sinusitis, unspecified chronicity S&S consistent with sinusitis, but due to fever and overlap of COVID signs Will be getting testing as well. Reviewed side effects, risks and benefits of medication.   Patient acknowledged agreement and understanding of the plan.    - amoxicillin-clavulanate (AUGMENTIN) 875-125 MG tablet; Take 1 tablet by mouth 2 (two) times daily for 5 days. Complete full course.  Dispense: 10 tablet; Refill: 0  2. Cough Increased coughing will rule out COVID. Does work in nursing home. Need to make sure she is not positive for allowing her back to work Patient acknowledged agreement and understanding of the plan.   Follow Up: as needed  Perlie Mayo, DNP, AGNP-BC [place], [address] [place], [place] [address] Office Hours: Mon-Thurs 8 am-5 pm; Fri 8 am-12 pm Office Phone:  [phone]  Office Fax: [phone]

## 2018-09-14 ENCOUNTER — Other Ambulatory Visit: Payer: Medicaid Other

## 2018-09-14 ENCOUNTER — Encounter: Payer: Self-pay | Admitting: Family Medicine

## 2018-09-14 ENCOUNTER — Other Ambulatory Visit: Payer: Self-pay

## 2018-09-14 DIAGNOSIS — Z20822 Contact with and (suspected) exposure to covid-19: Secondary | ICD-10-CM

## 2018-09-14 DIAGNOSIS — R6889 Other general symptoms and signs: Secondary | ICD-10-CM | POA: Diagnosis not present

## 2018-09-14 NOTE — Patient Instructions (Signed)
Thank you for coming into the office today. I appreciate the opportunity to provide you with the care for your health and wellness. Today we discussed: sinusitis versus COVID  Follow Up: as needed  Get COVID testing 09/14/2018  Take medication sent on 7/14.  Complete the full course. Drink water to stay hydrated and lubricate your throat.  Return to clinic or call is not better.  Remember the to go to the nearest ER if you develop increase coughing and breathing trouble.  Can use OTC cough syrup if needed.  Please continue to practice social distancing to keep you, your family, and our community safe.  If you must go out, please wear a Mask and practice good handwashing.   [place] YOUR HANDS WELL AND FREQUENTLY. AVOID TOUCHING YOUR FACE, UNLESS YOUR HANDS ARE FRESHLY WASHED.  GET FRESH AIR DAILY. STAY HYDRATED WITH WATER.   It was a pleasure to see you and I look forward to continuing to work together on your health and well-being. Please do not hesitate to call the office if you need care or have questions about your care.  Have a wonderful day and week.  With Gratitude,  Cherly Beach, DNP, AGNP-BC      Person Under Monitoring Name: Tracy Rojas  Location: [address] [place] [address]   Infection Prevention Recommendations for Individuals Confirmed to have, or Being Evaluated for, 2019 Novel Coronavirus (COVID-19) Infection Who Receive Care at Home  Individuals who are confirmed to have, or are being evaluated for, COVID-19 should follow the prevention steps below until a healthcare provider or local or state health department says they can return to normal activities.  Stay home except to get medical care You should restrict activities outside your home, except for getting medical care. Do not go to work, school, or public areas, and do not use public transportation or taxis.  Call ahead before visiting your doctor Before your medical  appointment, call the healthcare provider and tell them that you have, or are being evaluated for, COVID-19 infection. This will help the healthcare provider's office take steps to keep other people from getting infected. Ask your healthcare provider to call the local or state health department.  Monitor your symptoms Seek prompt medical attention if your illness is worsening (e.g., difficulty breathing). Before going to your medical appointment, call the healthcare provider and tell them that you have, or are being evaluated for, COVID-19 infection. Ask your healthcare provider to call the local or state health department.  Wear a facemask You should wear a facemask that covers your nose and mouth when you are in the same room with other people and when you visit a healthcare provider. People who live with or visit you should also wear a facemask while they are in the same room with you.  Separate yourself from other people in your home As much as possible, you should stay in a different room from other people in your home. Also, you should use a separate bathroom, if available.  Avoid sharing household items You should not share dishes, drinking glasses, cups, eating utensils, towels, bedding, or other items with other people in your home. After using these items, you should wash them thoroughly with soap and water.  Cover your coughs and sneezes Cover your mouth and nose with a tissue when you cough or sneeze, or you can cough or sneeze into your sleeve. Throw used tissues in a lined trash can, and immediately  wash your hands with soap and water for at least 20 seconds or use an alcohol-based hand rub.  Wash your [facility] your hands often and thoroughly with soap and water for at least 20 seconds. You can use an alcohol-based hand sanitizer if soap and water are not available and if your hands are not visibly dirty. Avoid touching your eyes, nose, and mouth with unwashed hands.    Prevention Steps for Caregivers and Household Members of Individuals Confirmed to have, or Being Evaluated for, COVID-19 Infection Being Cared for in the Home  If you live with, or provide care at home for, a person confirmed to have, or being evaluated for, COVID-19 infection please follow these guidelines to prevent infection:  Follow healthcare provider's instructions Make sure that you understand and can help the patient follow any healthcare provider instructions for all care.  Provide for the patient's basic needs You should help the patient with basic needs in the home and provide support for getting groceries, prescriptions, and other personal needs.  Monitor the patient's symptoms If they are getting sicker, call his or her medical provider and tell them that the patient has, or is being evaluated for, COVID-19 infection. This will help the healthcare provider's office take steps to keep other people from getting infected. Ask the healthcare provider to call the local or state health department.  Limit the number of people who have contact with the patient  If possible, have only one caregiver for the patient.  Other household members should stay in another home or place of residence. If this is not possible, they should stay  in another room, or be separated from the patient as much as possible. Use a separate bathroom, if available.  Restrict visitors who do not have an essential need to be in the home.  Keep older adults, very young children, and other sick people away from the patient Keep older adults, very young children, and those who have compromised immune systems or chronic health conditions away from the patient. This includes people with chronic heart, lung, or kidney conditions, diabetes, and cancer.  Ensure good ventilation Make sure that shared spaces in the home have good air flow, such as from an air conditioner or an opened window, weather permitting.   Wash your hands often  Wash your hands often and thoroughly with soap and water for at least 20 seconds. You can use an alcohol based hand sanitizer if soap and water are not available and if your hands are not visibly dirty.  Avoid touching your eyes, nose, and mouth with unwashed hands.  Use disposable paper towels to dry your hands. If not available, use dedicated cloth towels and replace them when they become wet.  Wear a facemask and gloves  Wear a disposable facemask at all times in the room and gloves when you touch or have contact with the patient's blood, body fluids, and/or secretions or excretions, such as sweat, saliva, sputum, nasal mucus, vomit, urine, or feces.  Ensure the mask fits over your nose and mouth tightly, and do not touch it during use.  Throw out disposable facemasks and gloves after using them. Do not reuse.  Wash your hands immediately after removing your facemask and gloves.  If your personal clothing becomes contaminated, carefully remove clothing and launder. Wash your hands after handling contaminated clothing.  Place all used disposable facemasks, gloves, and other waste in a lined container before disposing them with other household waste.  Remove gloves  and wash your hands immediately after handling these items.  Do not share dishes, glasses, or other household items with the patient  Avoid sharing household items. You should not share dishes, drinking glasses, cups, eating utensils, towels, bedding, or other items with a patient who is confirmed to have, or being evaluated for, COVID-19 infection.  After the person uses these items, you should wash them thoroughly with soap and water.  Wash laundry thoroughly  Immediately remove and wash clothes or bedding that have blood, body fluids, and/or secretions or excretions, such as sweat, saliva, sputum, nasal mucus, vomit, urine, or feces, on them.  Wear gloves when handling laundry from the patient.   Read and follow directions on labels of laundry or clothing items and detergent. In general, wash and dry with the warmest temperatures recommended on the label.  Clean all areas the individual has used often  Clean all touchable surfaces, such as counters, tabletops, doorknobs, bathroom fixtures, toilets, phones, keyboards, tablets, and bedside tables, every day. Also, clean any surfaces that may have blood, body fluids, and/or secretions or excretions on them.  Wear gloves when cleaning surfaces the patient has come in contact with.  Use a diluted bleach solution (e.g., dilute bleach with 1 part bleach and 10 parts water) or a household disinfectant with a label that says EPA-registered for coronaviruses. To make a bleach solution at home, add 1 tablespoon of bleach to 1 quart (4 cups) of water. For a larger supply, add  cup of bleach to 1 gallon (16 cups) of water.  Read labels of cleaning products and follow recommendations provided on product labels. Labels contain instructions for safe and effective use of the cleaning product including precautions you should take when applying the product, such as wearing gloves or eye protection and making sure you have good ventilation during use of the product.  Remove gloves and wash hands immediately after cleaning.  Monitor yourself for signs and symptoms of illness Caregivers and household members are considered close contacts, should monitor their health, and will be asked to limit movement outside of the home to the extent possible. Follow the monitoring steps for close contacts listed on the symptom monitoring form.   ? If you have additional questions, contact your local health department or call the epidemiologist on call at [phone] (available 24/7). ? This guidance is subject to change. For the most up-to-date guidance from [facility], please refer to their website:  [email or web address].pl

## 2018-09-18 LAB — NOVEL CORONAVIRUS, NAA: SARS-CoV-2, NAA: DETECTED — AB

## 2018-09-19 ENCOUNTER — Telehealth: Payer: Self-pay | Admitting: *Deleted

## 2018-09-19 ENCOUNTER — Telehealth: Payer: Self-pay | Admitting: Family Medicine

## 2018-09-19 ENCOUNTER — Ambulatory Visit: Payer: Self-pay | Admitting: *Deleted

## 2018-09-19 NOTE — Telephone Encounter (Signed)
Pt called back, not sure why you called her Mother--please call her on her phone [phone]

## 2018-09-19 NOTE — Telephone Encounter (Signed)
I called this positive COVID-19 test result to the [facility].   A message was left on the secure voicemail of [place] Nurse. I left the pt's name, address, and phone number along with my name and number in case there was a question.

## 2018-09-19 NOTE — Telephone Encounter (Signed)
Pt is calling she received a mychart message regarding Covid--and is concerned ,

## 2018-09-19 NOTE — Telephone Encounter (Signed)
Pt called in requesting her COVID-19 test result.   I let her know she is positive for the COVID-19 coronavirus.  I went over the home care advice and answered her questions.    She is going to call her provider, Cherly Beach, NP.    She is also going to call the [facility] for further monitoring at my recommendation.   I gave her the number for the health dept.  She was agreeable to calling the health dept and contacting her provider.    Reason for Disposition . [1] [id number] diagnosed by positive lab test AND [2] mild symptoms (e.g., cough, fever, others) AND [3] no complications or SOB    Positive COVID-19 test  Answer Assessment - Initial Assessment Questions 1. COVID-19 DIAGNOSIS: "Who made your Coronavirus (COVID-19) diagnosis?" "Was it confirmed by a positive lab test?" If not diagnosed by a HCP, ask "Are there lots of cases (community spread) where you live?" (See public health department website, if unsure)     Yes  Positive  for COVID-19 2. ONSET: "When did the COVID-19 symptoms start?"     2 weeks ago.   I saw my doctor thought had sinus infection.   I also had a UTI.  I'm on antibiotics now.    3. WORST SYMPTOM: "What is your worst symptom?" (e.g., cough, fever, shortness of breath, muscle aches)     Cough 4. COUGH: "Do you have a cough?" If so, ask: "How bad is the cough?"       Yes 5. FEVER: "Do you have a fever?" If so, ask: "What is your temperature, how was it measured, and when did it start?"     I did have a low grade fever.    6. RESPIRATORY STATUS: "Describe your breathing?" (e.g., shortness of breath, wheezing, unable to speak)      If I'm active I have shortness of breath.  Like cleaning the house 7. BETTER-SAME-WORSE: "Are you getting better, staying the same or getting worse compared to yesterday?"  If getting worse, ask, "In what way?"     *No Answer* 8. HIGH RISK DISEASE: "Do you have any chronic medical problems?" (e.g., asthma, heart or lung  disease, weak immune system, etc.)     No 9. PREGNANCY: "Is there any chance you are pregnant?" "When was your last menstrual period?"     Not asked 10. OTHER SYMPTOMS: "Do you have any other symptoms?"  (e.g., chills, fatigue, headache, loss of smell or taste, muscle pain, sore throat)       No  Protocols used: CORONAVIRUS (COVID-19) DIAGNOSED OR SUSPECTED-A-AH

## 2018-09-19 NOTE — Telephone Encounter (Signed)
Called patient back to discuss her positive covid testing. Patient unavailable. Left generic message with mother to have patient call office back.

## 2018-09-19 NOTE — Telephone Encounter (Signed)
Patients Covid results came back positive. Spoke with patient and went over her results and let her know she needs to self isolate for 14 days from the time she was tested. 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 Advised her to call her childrens doctor to see how to go about getting them tested. Another triage nurse has gone over everything else with her. Has been reported to the Health Dept.

## 2018-09-22 ENCOUNTER — Encounter: Payer: Self-pay | Admitting: Family Medicine

## 2018-11-03 ENCOUNTER — Telehealth: Payer: Medicaid Other | Admitting: Family Medicine

## 2018-11-03 ENCOUNTER — Other Ambulatory Visit: Payer: Self-pay

## 2018-11-04 ENCOUNTER — Ambulatory Visit: Payer: Medicaid Other | Admitting: Family Medicine

## 2019-01-29 ENCOUNTER — Emergency Department (HOSPITAL_COMMUNITY)
Admission: EM | Admit: 2019-01-29 | Discharge: 2019-01-29 | Disposition: A | Discharge status: Home / Self Care | Payer: Medicaid Other | Attending: Emergency Medicine | Admitting: Emergency Medicine

## 2019-01-29 ENCOUNTER — Encounter (HOSPITAL_COMMUNITY): Payer: Self-pay

## 2019-01-29 ENCOUNTER — Other Ambulatory Visit: Payer: Self-pay

## 2019-01-29 ENCOUNTER — Emergency Department (HOSPITAL_COMMUNITY): Payer: Medicaid Other

## 2019-01-29 DIAGNOSIS — Y990 Civilian activity done for income or pay: Secondary | ICD-10-CM | POA: Insufficient documentation

## 2019-01-29 DIAGNOSIS — W19XXXA Unspecified fall, initial encounter: Secondary | ICD-10-CM | POA: Diagnosis not present

## 2019-01-29 DIAGNOSIS — M25562 Pain in left knee: Secondary | ICD-10-CM | POA: Diagnosis not present

## 2019-01-29 DIAGNOSIS — S8992XA Unspecified injury of left lower leg, initial encounter: Secondary | ICD-10-CM | POA: Diagnosis not present

## 2019-01-29 DIAGNOSIS — Y939 Activity, unspecified: Secondary | ICD-10-CM | POA: Insufficient documentation

## 2019-01-29 DIAGNOSIS — Y929 Unspecified place or not applicable: Secondary | ICD-10-CM | POA: Diagnosis not present

## 2019-01-29 DIAGNOSIS — S8002XA Contusion of left knee, initial encounter: Secondary | ICD-10-CM | POA: Insufficient documentation

## 2019-01-29 NOTE — ED Provider Notes (Signed)
[facility] EMERGENCY DEPARTMENT Provider Note   CSN: [id number] Arrival date & time: 01/29/19  2212     History   Chief Complaint Chief Complaint  Patient presents with  . Fall    HPI Tracy Rojas is a 43 y.o. female.     Presents to the emergency department for evaluation after a fall.  Patient reports that she fell at work tonight, landing on both of her knees.  Patient complaining of pain of the left knee.  Patient reports that she fell directly on the knee, does not remember any specific twisting motion to the knee.  No other injury, did not hit her head.     Past Medical History:  Diagnosis Date  . AMA (advanced maternal age) multigravida 64+   . Bronchitis 04/2012  . GERD (gastroesophageal reflux disease)   . Headache(784.0)   . History of pre-eclampsia in prior pregnancy, currently pregnant   . Normal pregnancy in multigravida in third trimester 10/01/2016  . SVD (spontaneous vaginal delivery) 10/05/2016    Patient Active Problem List   Diagnosis Date Noted  . 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 Class 3 severe obesity due to excess calories without serious comorbidity with body mass index (BMI) of 40.0 to 44.9 in adult ([place]) 07/01/2018  . Chronic pain of left knee 07/01/2018  . Prehypertension 07/01/2018  . 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 Indication for care in labor or delivery 10/05/2016  . SVD (spontaneous vaginal delivery) 10/05/2016  . Normal pregnancy in multigravida in third trimester 10/01/2016  . Impaired glucose tolerance in pregnancy 05/17/2015  . Advanced maternal age in multigravida 05/17/2015  . 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 Normal vaginal delivery 05/17/2015  . Mild preeclampsia 05/16/2015  . Vitamin D deficiency 09/14/2014  . Migraine without aura 12/13/2012  . Tension headache 12/13/2012  . Thrombocytopenia ([place]) 02/17/2012    Past Surgical History:  Procedure Laterality Date  . TIBIA FRACTURE SURGERY Left 2004   rod and two screws due to trauma     OB History    Gravida  5   Para  5   Term  4   Preterm  1   AB      Living  4     SAB      TAB      Ectopic      Multiple  0   Live Births  4            Home Medications    Prior to Admission medications   Medication Sig Start Date End Date Taking? Authorizing Provider  acetaminophen (TYLENOL) 500 MG tablet Take 1,000 mg by mouth every 6 (six) hours as needed for mild pain, moderate pain, fever or headache.    [provider]  fluticasone (FLONASE) 50 MCG/ACT nasal spray Place 2 sprays into both nostrils daily. 09/06/18   Perlie Mayo, NP    Family History Family History  Problem Relation Age of Onset  . Hypertension Mother   . Migraines Mother        vascular migraines  . Hypertension Father   . Cancer Father        Throat    Social History Social History   Tobacco Use  . Smoking status: Never Smoker  . Smokeless tobacco: Never Used  Substance Use Topics  . Alcohol use: No  . Drug use: No     Allergies   Dimetapp c [phenylephrine-bromphen-codeine] and Ibuprofen   Review of Systems Review of  Systems  Musculoskeletal: Positive for arthralgias.  Neurological: Negative.      Physical Exam Updated Vital Signs BP (!) 131/59 (BP Location: Right Arm)   Pulse 86   Temp 98.6 F (37 C) (Oral)   Resp 18   Ht 5\' 3"  (1.6 m)   Wt 90.7 kg   LMP 01/25/2019   SpO2 95%   BMI 35.43 kg/m   Physical Exam Constitutional:      Appearance: Normal appearance. She is normal weight.  HENT:     Head: Atraumatic.  Pulmonary:     Effort: Pulmonary effort is normal.  Musculoskeletal:     Left knee: She exhibits normal range of motion, no swelling, no effusion, no ecchymosis, no deformity, no laceration, no erythema, normal alignment, no LCL laxity, normal patellar mobility and no MCL laxity. Tenderness found.  Skin:    Findings: Abrasion (left anterior knee) present.  Neurological:     Mental Status:  She is alert.     Cranial Nerves: Cranial nerves are intact.     Sensory: Sensation is intact.     Motor: Motor function is intact.     Coordination: Coordination is intact.      ED Treatments / Results  Labs (all labs ordered are listed, but only abnormal results are displayed) Labs Reviewed - No data to display  EKG None  Radiology Dg Knee Complete 4 Views Left  Result Date: 01/29/2019 CLINICAL DATA:  43 year old female with left knee pain after fall at work. EXAM: LEFT KNEE - COMPLETE 4+ VIEW COMPARISON:  Left knee series 11/11/2017. FINDINGS: Previous left tibia ORIF. Visible hardware appears stable since 2019 although with increased adjacent sclerosis in the proximal tibia shaft. Partially visible healed chronic fibula shaft deformity. No evidence of joint effusion. Stable joint spaces and alignment at the left knee. No acute osseous abnormality identified. IMPRESSION: 1.  No acute osseous abnormality identified at the left knee. 2. Partially visible previous left tib-fib deformity and ORIF. Electronically Signed   By: Genevie Ann M.D.   On: 01/29/2019 22:40    Procedures Procedures (including critical care time)  Medications Ordered in ED Medications - No data to display   Initial Impression / Assessment and Plan / ED Course  I have reviewed the triage vital signs and the nursing notes.  Pertinent labs & imaging results that were available during my care of the patient were reviewed by me and considered in my medical decision making (see chart for details).        Patient presents with left knee injury after a fall.  Patient reports falling directly onto both of her knees, injuring the left knee.  She has a very superficial abrasion over the left knee.  Range of motion is preserved, no joint effusion, swelling, deformity.  X-ray unremarkable.  Extension preserved, no patella tendon injury.  Treat as simple contusion.  Refer to Ortho if not improving in 3 to 5 days.  Patient  informed that the splint may be changed by [employer]  She should check with her supervisor.  Final Clinical Impressions(s) / ED Diagnoses   Final diagnoses:  Contusion of left knee, initial encounter    ED Discharge Orders    None       Orpah Greek, MD 01/29/19 2312

## 2019-01-29 NOTE — ED Triage Notes (Signed)
Pt fell at work tonight and right knee. Pt denies LOC or hitting head. 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

## 2019-04-11 ENCOUNTER — Encounter: Payer: Self-pay | Admitting: Family Medicine

## 2019-04-11 ENCOUNTER — Other Ambulatory Visit: Payer: Self-pay

## 2019-04-11 ENCOUNTER — Ambulatory Visit (INDEPENDENT_AMBULATORY_CARE_PROVIDER_SITE_OTHER): Payer: Medicaid Other | Admitting: Family Medicine

## 2019-04-11 VITALS — BP 112/82 | HR 88 | Temp 97.7°F | Resp 15 | Ht 63.0 in | Wt 219.1 lb

## 2019-04-11 DIAGNOSIS — R2232 Localized swelling, mass and lump, left upper limb: Secondary | ICD-10-CM

## 2019-04-11 DIAGNOSIS — R030 Elevated blood-pressure reading, without diagnosis of hypertension: Secondary | ICD-10-CM | POA: Diagnosis not present

## 2019-04-11 DIAGNOSIS — E669 Obesity, unspecified: Secondary | ICD-10-CM | POA: Diagnosis not present

## 2019-04-11 HISTORY — DX: Localized swelling, mass and lump, left upper limb: R22.32

## 2019-04-11 NOTE — Progress Notes (Signed)
Bump?      Subjective:  Patient ID: Tracy Rojas, female    DOB: Jul 30, 1975  Age: 44 y.o. MRN: [id number]  CC:  Chief Complaint  Patient presents with  . bump on left middle finger      HPI  HPI  Tracy Rojas is a 44 year old female who presents today with a knot on her middle finger left hand.  She reports about a month to month and a half ago she felt like it was like a blisterlike bump that came up.  It started off looking much like a blister but she could not pop it even though she pressed on it now is very much just a hard bump and has gotten harder over the last several weeks.  It has become more painful to touch, painful when it gets bumped or sometimes painful when trying to use her hands.  Its present always and never goes get it comes back.  She has not tried to apply anything over-the-counter for it.  It is not appear to have any seeds in it.  She has not had this issue before.  She would like to have it removed.  And is open to going to dermatology.  Today patient denies signs and symptoms of COVID 19 infection including fever, chills, cough, shortness of breath, and headache. Past Medical, Surgical, Social History, Allergies, and Medications have been Reviewed.   Past Medical History:  Diagnosis Date  . Advanced maternal age in multigravida 05/17/2015  . AMA (advanced maternal age) multigravida 40+   . Bronchitis 04/2012  . 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 GERD (gastroesophageal reflux disease)   . Headache(784.0)   . History of pre-eclampsia in prior pregnancy, currently pregnant   . Impaired glucose tolerance in pregnancy 05/17/2015  . Mild preeclampsia 05/16/2015  . Normal pregnancy in multigravida in third trimester 10/01/2016  . Normal pregnancy in multigravida in third trimester 10/01/2016  . Normal vaginal delivery 05/17/2015  . SVD (spontaneous vaginal delivery) 10/05/2016  . Tension headache 12/13/2012  . Thrombocytopenia ([place]) 02/17/2012    Current Meds  Medication  Sig  . acetaminophen (TYLENOL) 500 MG tablet Take 1,000 mg by mouth every 6 (six) hours as needed for mild pain, moderate pain, fever or headache.  . fluticasone (FLONASE) 50 MCG/ACT nasal spray Place 2 sprays into both nostrils daily.  . nabumetone (RELAFEN) 750 MG tablet Take 750 mg by mouth 2 (two) times daily.    ROS:  Review of Systems  HENT: Negative.   Eyes: Negative.   Respiratory: Negative.   Cardiovascular: Negative.   Gastrointestinal: Negative.   Genitourinary: Negative.   Musculoskeletal: Negative.   Skin: Negative.        Bump on finger left hand   Neurological: Negative.   Endo/Heme/Allergies: Negative.   Psychiatric/Behavioral: Negative.   All other systems reviewed and are negative.    Objective:   Today's Vitals: BP 112/82   Pulse 88   Temp 97.7 F (36.5 C) (Temporal)   Resp 15   Ht 5\' 3"  (1.6 m)   Wt 219 lb 1.9 oz (99.4 kg)   SpO2 98%   BMI 38.82 kg/m  Vitals with BMI 04/11/2019 01/29/2019 09/14/2018  Height 5\' 3"  5\' 3"  5\' 2"   Weight 219 lbs 2 oz 200 lbs 218 lbs  BMI 38.83 [id number]  Systolic [id number]  Diastolic 82 59 78  Pulse 88 86 102     Physical Exam Vitals and nursing note reviewed.  Constitutional:  Appearance: Normal appearance. She is well-developed and well-groomed. She is obese.  HENT:     Head: Normocephalic and atraumatic.     Comments: Mask in place     Right Ear: External ear normal.     Left Ear: External ear normal.  Eyes:     General:        Right eye: No discharge.        Left eye: No discharge.     Conjunctiva/sclera: Conjunctivae normal.  Cardiovascular:     Rate and Rhythm: Normal rate and regular rhythm.     Pulses: Normal pulses.     Heart sounds: Normal heart sounds.  Pulmonary:     Effort: Pulmonary effort is normal.     Breath sounds: Normal breath sounds.  Musculoskeletal:        General: Normal range of motion.     Cervical back: Normal range of motion and neck supple.     Comments: Left hand:  bump on DIP of middle finger, lateral side. Small white center. Firm/Hard feel. No warmth, redness, or infection at site.  Skin:    General: Skin is warm.  Neurological:     General: No focal deficit present.     Mental Status: She is alert and oriented to person, place, and time.  Psychiatric:        Attention and Perception: Attention normal.        Mood and Affect: Mood normal.        Speech: Speech normal.        Behavior: Behavior normal. Behavior is cooperative.        Thought Content: Thought content normal.        Cognition and Memory: Cognition normal.        Judgment: Judgment normal.        Assessment   1. Finger mass, left   2. Obesity (BMI 30-39.9)   3. Prehypertension     Tests ordered Orders Placed This Encounter  Procedures  . Ambulatory referral to Dermatology     Plan: Please see assessment and plan per problem list above.   No orders of the defined types were placed in this encounter.   Patient to follow-up in 09/19/2019   Perlie Mayo, NP

## 2019-04-11 NOTE — Assessment & Plan Note (Signed)
Questionable mass on left hand third digit DIP.  Hard to discern whether or not this is the start of a wart, cyst or something of that nature.  She wants it removed due to being on the joint line and possibly could be bigger than suspected I have opted to send her to dermatology for appropriate removal measures.  She is okay with this as long as she can just get it removed.  She is advised to put a bandage on it to protect it for now and prevent it from coming in so much pain when it gets hit.Patient acknowledged agreement and understanding of the plan.

## 2019-04-11 NOTE — Patient Instructions (Addendum)
Happy New Year! May you have a year filled with hope, love, happiness and laughter.  I appreciate the opportunity to provide you with care for your health and wellness. Today we discussed: bump on finger   Follow up: 5 months  Annual no pap  No labs   Referral to Dermatology for bump on finger-removal needs.  Please continue to practice social distancing to keep you, your family, and our community safe.  If you must go out, please wear a mask and practice good handwashing.  It was a pleasure to see you and I look forward to continuing to work together on your health and well-being. Please do not hesitate to call the office if you need care or have questions about your care.  Have a wonderful day and week. With Gratitude, Cherly Beach, DNP, AGNP-BC

## 2019-04-11 NOTE — Assessment & Plan Note (Signed)
  Tracy Rojas is re-educated about the importance of exercise daily to help with weight management. A minumum of 30 minutes daily is recommended. Additionally, importance of healthy food choices  with portion control discussed.  Wt Readings from Last 3 Encounters:  04/11/19 219 lb 1.9 oz (99.4 kg)  01/29/19 200 lb (90.7 kg)  09/14/18 218 lb (98.9 kg)

## 2019-04-11 NOTE — Assessment & Plan Note (Signed)
Improved today.

## 2019-04-25 ENCOUNTER — Other Ambulatory Visit: Payer: Self-pay

## 2019-04-25 ENCOUNTER — Ambulatory Visit (INDEPENDENT_AMBULATORY_CARE_PROVIDER_SITE_OTHER): Payer: Medicaid Other

## 2019-04-25 DIAGNOSIS — Z111 Encounter for screening for respiratory tuberculosis: Secondary | ICD-10-CM

## 2019-04-27 ENCOUNTER — Ambulatory Visit: Payer: Medicaid Other | Admitting: Family Medicine

## 2019-04-27 LAB — TB SKIN TEST
Induration: 0 mm
TB Skin Test: NEGATIVE

## 2019-05-02 ENCOUNTER — Ambulatory Visit (INDEPENDENT_AMBULATORY_CARE_PROVIDER_SITE_OTHER): Payer: Medicaid Other | Admitting: Family Medicine

## 2019-05-02 ENCOUNTER — Other Ambulatory Visit: Payer: Self-pay

## 2019-05-02 ENCOUNTER — Encounter: Payer: Self-pay | Admitting: Family Medicine

## 2019-05-02 DIAGNOSIS — F329 Major depressive disorder, single episode, unspecified: Secondary | ICD-10-CM

## 2019-05-02 DIAGNOSIS — F32A Depression, unspecified: Secondary | ICD-10-CM

## 2019-05-02 MED ORDER — ESCITALOPRAM OXALATE 10 MG PO TABS: 10.0000 mg | ORAL_TABLET | Freq: Every day | ORAL | 0 refills | Status: DC

## 2019-05-02 NOTE — Progress Notes (Signed)
Virtual Visit via Telephone Note   This visit type was conducted due to national recommendations for restrictions regarding the COVID-19 Pandemic (e.g. social distancing) in an effort to limit this patient's exposure and mitigate transmission in our community.  Due to her co-morbid illnesses, this patient is at least at moderate risk for complications without adequate follow up.  This format is felt to be most appropriate for this patient at this time.  The patient did not have access to video technology/had technical difficulties with video requiring transitioning to audio format only (telephone).  All issues noted in this document were discussed and addressed.  No physical exam could be performed with this format.     Evaluation Performed:  Follow-up visit  Date:  05/02/2019   ID:  Tracy Rojas, DOB 1976/02/11, MRN [id number]  Patient Location: Home Provider Location: Office  Location of Patient: Home Location of Provider: Telehealth Consent was obtain for visit to be over via telehealth. I verified that I am speaking with the correct person using two identifiers.  PCP:  Perlie Mayo, NP   Chief Complaint:  Depression   History of Present Illness:    Tracy Rojas is a 44 y.o. female with history of GERD, headache among others.  Presents today to follow-up on depression.  Reports that she noticed she was not feeling her greatest self but did not think that she was depressed.  Had a friend pointed out to her that she was withdrawing from doing certain things.  These things included cooking for her children are the desire to cook at all.  She is also desire to participate in anything that she does not have to participate in.  She does not want to feel like this she wants to be a part of her children's life and she she is having a really hard time with this.  Is open to taking medication and therapy if needed.  The patient does not have symptoms concerning for COVID-19  infection (fever, chills, cough, or new shortness of breath).   Past Medical, Surgical, Social History, Allergies, and Medications have been Reviewed.  Past Medical History:  Diagnosis Date  . Advanced maternal age in multigravida 05/17/2015  . AMA (advanced maternal age) multigravida 70+   . Bronchitis 04/2012  . 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 GERD (gastroesophageal reflux disease)   . Headache(784.0)   . History of pre-eclampsia in prior pregnancy, currently pregnant   . Impaired glucose tolerance in pregnancy 05/17/2015  . Mild preeclampsia 05/16/2015  . Normal pregnancy in multigravida in third trimester 10/01/2016  . Normal pregnancy in multigravida in third trimester 10/01/2016  . Normal vaginal delivery 05/17/2015  . SVD (spontaneous vaginal delivery) 10/05/2016  . Tension headache 12/13/2012  . Thrombocytopenia ([place]) 02/17/2012   Past Surgical History:  Procedure Laterality Date  . TIBIA FRACTURE SURGERY Left 2004   rod and two screws due to trauma     Current Meds  Medication Sig  . acetaminophen (TYLENOL) 500 MG tablet Take 1,000 mg by mouth every 6 (six) hours as needed for mild pain, moderate pain, fever or headache.  . fluticasone (FLONASE) 50 MCG/ACT nasal spray Place 2 sprays into both nostrils daily.  . nabumetone (RELAFEN) 750 MG tablet Take 750 mg by mouth 2 (two) times daily.     Allergies:   Dimetapp c [phenylephrine-bromphen-codeine] and Ibuprofen   ROS:   Please see the history of present illness.    All other systems reviewed  and are negative.   Labs/Other Tests and Data Reviewed:    Recent Labs: No results found for requested labs within last 8760 hours.   Recent Lipid Panel Lab Results  Component Value Date/Time   CHOL 159 09/11/2014 03:55 PM   TRIG 239 (H) 09/11/2014 03:55 PM   HDL 39 (L) 09/11/2014 03:55 PM   CHOLHDL 4.1 09/11/2014 03:55 PM   LDLCALC 72 09/11/2014 03:55 PM    Wt Readings from Last 3 Encounters:  04/11/19 219 lb 1.9 oz (99.4 kg)   01/29/19 200 lb (90.7 kg)  09/14/18 218 lb (98.9 kg)     Objective:    Vital Signs:  There were no vitals taken for this visit.   GEN:  alert and oriented  RESPIRATORY:  no shortness of breath in conversation  PSYCH:  normal affect and mood    Depression screen [facility] 2/9 05/02/2019 04/11/2019 09/14/2018 09/06/2018 07/26/2018  Decreased Interest 0 0 0 - 0  Down, Depressed, Hopeless 3 0 0 0 0  PHQ - 2 Score 3 0 0 0 0  Altered sleeping 1 - - - -  Tired, decreased energy 3 - - - -  Change in appetite 3 - - - -  Feeling bad or failure about yourself  0 - - - -  Trouble concentrating 3 - - - -  Moving slowly or fidgety/restless 0 - - - -  Suicidal thoughts 0 - - - -  PHQ-9 Score 13 - - - -       ASSESSMENT & PLAN:    1. Depression, unspecified depression type  - escitalopram (LEXAPRO) 10 MG tablet; Take 1 tablet (10 mg total) by mouth daily.  Dispense: 30 tablet; Refill: 0  Time:   Today, I have spent 20 minutes with the patient with telehealth technology discussing the above problems.     Medication Adjustments/Labs and Tests Ordered: Current medicines are reviewed at length with the patient today.  Concerns regarding medicines are outlined above.   Tests Ordered: No orders of the defined types were placed in this encounter.   Medication Changes: No orders of the defined types were placed in this encounter.   Disposition:  Follow up 6 weeks  Signed, Perlie Mayo, NP  05/02/2019 2:31 PM     [facility]

## 2019-05-02 NOTE — Assessment & Plan Note (Signed)
Denies having any SI or HI.  PHQ 13.  Reports that she did not realize that she was depressed at first but now she realizes the signs and symptoms.  Is open to going on medication and therapy if medication does not work or if she needs it to be included with her treatment.  We will start her on low-dose Lexapro and follow-up in 6 weeks to see how she is doing.  Advised for her to reach out if she has any issues or concerns.  .hmms Patient acknowledged agreement and understanding of the plan.

## 2019-05-02 NOTE — Patient Instructions (Signed)
I appreciate the opportunity to provide you with care for your health and wellness. Today we discussed: mood   Follow up: 6 weeks   No labs or referrals today  Please start taking the medication we discussed as directed Call if you have any concerns.  Please continue to practice social distancing to keep you, your family, and our community safe.  If you must go out, please wear a mask and practice good handwashing.  It was a pleasure to see you and I look forward to continuing to work together on your health and well-being. Please do not hesitate to call the office if you need care or have questions about your care.  Have a wonderful day and week. With Gratitude, Cherly Beach, DNP, AGNP-BC

## 2019-05-24 ENCOUNTER — Ambulatory Visit (INDEPENDENT_AMBULATORY_CARE_PROVIDER_SITE_OTHER): Payer: Medicaid Other | Admitting: Family Medicine

## 2019-05-24 ENCOUNTER — Other Ambulatory Visit (HOSPITAL_COMMUNITY)
Admission: RE | Admit: 2019-05-24 | Discharge: 2019-05-24 | Disposition: A | Discharge status: Home / Self Care | Payer: Medicaid Other | Source: Other Acute Inpatient Hospital | Attending: Family Medicine | Admitting: Family Medicine

## 2019-05-24 ENCOUNTER — Encounter: Payer: Self-pay | Admitting: Family Medicine

## 2019-05-24 ENCOUNTER — Other Ambulatory Visit: Payer: Self-pay

## 2019-05-24 VITALS — BP 144/68 | HR 90 | Temp 98.2°F | Ht 62.0 in | Wt 224.4 lb

## 2019-05-24 DIAGNOSIS — N3001 Acute cystitis with hematuria: Secondary | ICD-10-CM | POA: Insufficient documentation

## 2019-05-24 HISTORY — DX: Acute cystitis with hematuria: N30.01

## 2019-05-24 LAB — POCT URINALYSIS DIP (CLINITEK)
Bilirubin, UA: NEGATIVE
Glucose, UA: NEGATIVE mg/dL
Ketones, POC UA: NEGATIVE mg/dL
Nitrite, UA: NEGATIVE
POC PROTEIN,UA: NEGATIVE
Spec Grav, UA: 1.025 (ref 1.010–1.025)
Urobilinogen, UA: 0.2 E.U./dL
pH, UA: 6 (ref 5.0–8.0)

## 2019-05-24 MED ORDER — CIPROFLOXACIN HCL 500 MG PO TABS: 500.0000 mg | ORAL_TABLET | Freq: Two times a day (BID) | ORAL | 0 refills | Status: AC

## 2019-05-24 NOTE — Progress Notes (Signed)
Subjective:  Patient ID: Georgina Quint, female    DOB: [birth year]  Age: 44 y.o. MRN: [id number]  CC:  Chief Complaint  Patient presents with  . Urinary Tract Infection    possible uti x2 weeks back pain on the right side sometimes radaites down right leg no burning no odor       HPI  HPI  Ms. Goodwine is a 44 year old female patient of mine.  With a history of of obesity, prehypertension, among others. She presents today after having a possible urinary tract infection and discomfort in her back for 2 weeks.  Predominantly on the right side sometimes radiates down the right leg with.  She denies any odor or burning sensation.  Has an increase in discomfort over the last couple of days which prompted her to come be seen.  She denies having any increased frequency or urination.  She denies having any changes in activities including bubble baths or intercourse.  Reports that she has been following good hygiene techniques and wiping front to back.  Denies having any injury or trauma to the low back.  Denies having any nausea or vomiting.  Denies having any changes in bowel or bladder habits.  Denies having any chest pain, shortness of breath or any other signs or symptoms of infection.  Today patient denies signs and symptoms of COVID 19 infection including fever, chills, cough, shortness of breath, and headache. Past Medical, Surgical, Social History, Allergies, and Medications have been Reviewed.   Past Medical History:  Diagnosis Date  . Advanced maternal age in multigravida 05/17/2015  . AMA (advanced maternal age) multigravida 67+   . Bronchitis 04/2012  . 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 GERD (gastroesophageal reflux disease)   . Headache(784.0)   . History of pre-eclampsia in prior pregnancy, currently pregnant   . Impaired glucose tolerance in pregnancy 05/17/2015  . Mild preeclampsia 05/16/2015  . Normal pregnancy in multigravida in third trimester 10/01/2016  . Normal pregnancy in  multigravida in third trimester 10/01/2016  . Normal vaginal delivery 05/17/2015  . SVD (spontaneous vaginal delivery) 10/05/2016  . Tension headache 12/13/2012  . Thrombocytopenia ([place]) 02/17/2012    Current Meds  Medication Sig  . acetaminophen (TYLENOL) 500 MG tablet Take 1,000 mg by mouth every 6 (six) hours as needed for mild pain, moderate pain, fever or headache.  . escitalopram (LEXAPRO) 10 MG tablet Take 1 tablet (10 mg total) by mouth daily.    ROS:  Review of Systems  Constitutional: Negative.   HENT: Negative.   Eyes: Negative.   Respiratory: Negative.   Cardiovascular: Negative.   Gastrointestinal: Negative.   Genitourinary: Negative.   Musculoskeletal:       See HPI  Skin: Negative.   Neurological: Negative.   Endo/Heme/Allergies: Negative.   Psychiatric/Behavioral: Negative.   All other systems reviewed and are negative.    Objective:   Today's Vitals: BP (!) 144/68 (BP Location: Right Arm, Patient Position: Sitting, Cuff Size: Normal)   Pulse 90   Temp 98.2 F (36.8 C) (Temporal)   Ht 5\' 2"  (1.575 m)   Wt 224 lb 6.4 oz (101.8 kg)   SpO2 98%   BMI 41.04 kg/m  Vitals with BMI 05/24/2019 04/11/2019 01/29/2019  Height 5\' 2"  5\' 3"  5\' 3"   Weight 224 lbs 6 oz 219 lbs 2 oz 200 lbs  BMI 41.03 [id number] [id number]  Systolic 123456 XX123456 [id number]  Diastolic 68 82 59  Pulse 90 88 86  Physical Exam Vitals and nursing note reviewed.  Constitutional:      Appearance: Normal appearance. She is well-developed and well-groomed. She is obese.  HENT:     Head: Normocephalic and atraumatic.     Right Ear: External ear normal.     Left Ear: External ear normal.     Mouth/Throat:     Comments: Mask in place Eyes:     General:        Right eye: No discharge.        Left eye: No discharge.     Conjunctiva/sclera: Conjunctivae normal.  Cardiovascular:     Rate and Rhythm: Normal rate and regular rhythm.     Pulses: Normal pulses.     Heart sounds: Normal heart sounds.  Pulmonary:      Effort: Pulmonary effort is normal.     Breath sounds: Normal breath sounds.  Abdominal:     Tenderness: There is no right CVA tenderness or left CVA tenderness.  Musculoskeletal:        General: Normal range of motion.     Cervical back: Normal range of motion and neck supple.  Skin:    General: Skin is warm.  Neurological:     General: No focal deficit present.     Mental Status: She is alert and oriented to person, place, and time.  Psychiatric:        Attention and Perception: Attention normal.        Mood and Affect: Mood normal.        Speech: Speech normal.        Behavior: Behavior normal. Behavior is cooperative.        Thought Content: Thought content normal.        Cognition and Memory: Cognition normal.        Judgment: Judgment normal.     Assessment   1. Acute cystitis with hematuria     Tests ordered Orders Placed This Encounter  Procedures  . Urine Culture  . POCT URINALYSIS DIP (CLINITEK)     Plan: Please see assessment and plan per problem list above.   Meds ordered this encounter  Medications  . ciprofloxacin (CIPRO) 500 MG tablet    Sig: Take 1 tablet (500 mg total) by mouth 2 (two) times daily for 5 days.    Dispense:  10 tablet    Refill:  0    Order Specific Question:   Supervising Provider    Answer:   Fayrene Helper [id number]    Patient to follow-up in 09/19/2019.  Perlie Mayo, NP

## 2019-05-24 NOTE — Patient Instructions (Signed)
I appreciate the opportunity to provide you with care for your health and wellness. Today we discussed: UTI  Follow up: as scheduled   No labs or referrals today  We will send out your urine  Please take Cipro as directed Pick up probiotics  Increase water intake  Please continue to practice social distancing to keep you, your family, and our community safe.  If you must go out, please wear a mask and practice good handwashing.  It was a pleasure to see you and I look forward to continuing to work together on your health and well-being. Please do not hesitate to call the office if you need care or have questions about your care.  Have a wonderful day and week. With Gratitude, Cherly Beach, DNP, AGNP-BC

## 2019-05-24 NOTE — Assessment & Plan Note (Signed)
Positive urine dip.  Signs and symptoms are questionable as to whether or not it is in the bladder versus the kidney.  Will treat as if complicated to make sure that she can avoid pyelonephritis and possible hospitalization needs.  Reviewed urinary tract prevention measures.  Pending culture will adjust medication if needed.  Advised to get probiotics for medication use.  If she needs any Diflucan for yeast she is advised to call the office back.  She is encouraged to make sure that she completes the full course of antibiotics.   Reviewed side effects, risks and benefits of medication.   Patient acknowledged agreement and understanding of the plan.

## 2019-05-25 LAB — URINE CULTURE

## 2019-06-13 ENCOUNTER — Encounter: Payer: Self-pay | Admitting: Family Medicine

## 2019-06-14 ENCOUNTER — Other Ambulatory Visit: Payer: Self-pay | Admitting: *Deleted

## 2019-06-14 DIAGNOSIS — F32A Depression, unspecified: Secondary | ICD-10-CM

## 2019-06-14 DIAGNOSIS — F329 Major depressive disorder, single episode, unspecified: Secondary | ICD-10-CM

## 2019-06-14 MED ORDER — ESCITALOPRAM OXALATE 10 MG PO TABS: 10.0000 mg | ORAL_TABLET | Freq: Every day | ORAL | 1 refills | Status: DC

## 2019-06-16 ENCOUNTER — Encounter: Payer: Self-pay | Admitting: Family Medicine

## 2019-06-20 ENCOUNTER — Other Ambulatory Visit: Payer: Self-pay

## 2019-06-20 ENCOUNTER — Encounter: Payer: Self-pay | Admitting: Family Medicine

## 2019-06-20 ENCOUNTER — Ambulatory Visit (INDEPENDENT_AMBULATORY_CARE_PROVIDER_SITE_OTHER): Payer: Medicaid Other | Admitting: Family Medicine

## 2019-06-20 VITALS — BP 144/68 | Ht 62.0 in | Wt 224.0 lb

## 2019-06-20 DIAGNOSIS — M5431 Sciatica, right side: Secondary | ICD-10-CM | POA: Insufficient documentation

## 2019-06-20 MED ORDER — PREDNISONE 10 MG (21) PO TBPK: ORAL_TABLET | ORAL | 0 refills | Status: DC

## 2019-06-20 NOTE — Progress Notes (Signed)
Virtual Visit via Telephone Note   This visit type was conducted due to national recommendations for restrictions regarding the COVID-19 Pandemic (e.g. social distancing) in an effort to limit this patient's exposure and mitigate transmission in our community.  Due to her co-morbid illnesses, this patient is at least at moderate risk for complications without adequate follow up.  This format is felt to be most appropriate for this patient at this time.  The patient did not have access to video technology/had technical difficulties with video requiring transitioning to audio format only (telephone).  All issues noted in this document were discussed and addressed.  No physical exam could be performed with this format.   Evaluation Performed:  Follow-up visit  Date:  06/20/2019   ID:  Tracy Rojas, DOB 1975-05-05, MRN [id number]  Patient Location: Home Provider Location: Office  Location of Patient: Home Location of Provider: Telehealth Consent was obtain for visit to be over via telehealth. I verified that I am speaking with the correct person using two identifiers.  PCP:  Perlie Mayo, NP   Chief Complaint:  Leg/back pain  History of Present Illness:    Tracy Rojas is a 44 y.o. female with history of GERD, headaches among others. Presents today with some back and leg pain.  Right-sided started about 2 months ago.  Off and on times short in duration when it does occur but very aggravating with radiating achy discomfort.  Sometimes shocklike pain going down the back of her leg, on the right side.  It is aggravated with laying in bed a certain way when she brings her legs up.  Bending, stooping or squatting sometimes sitting.  Has not been doing anything that relieves it resting and laying a certain way.  She has never had diagnosis of sciatica in the past.  Pain is described as a 5 out of 10 when it is bothering her.  The patient does not have symptoms concerning for  COVID-19 infection (fever, chills, cough, or new shortness of breath).   Past Medical, Surgical, Social History, Allergies, and Medications have been Reviewed.  Past Medical History:  Diagnosis Date  . Advanced maternal age in multigravida 05/17/2015  . AMA (advanced maternal age) multigravida 17+   . Bronchitis 04/2012  . 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 GERD (gastroesophageal reflux disease)   . Headache(784.0)   . History of pre-eclampsia in prior pregnancy, currently pregnant   . Impaired glucose tolerance in pregnancy 05/17/2015  . Mild preeclampsia 05/16/2015  . Normal pregnancy in multigravida in third trimester 10/01/2016  . Normal pregnancy in multigravida in third trimester 10/01/2016  . Normal vaginal delivery 05/17/2015  . SVD (spontaneous vaginal delivery) 10/05/2016  . Tension headache 12/13/2012  . Thrombocytopenia ([place]) 02/17/2012   Past Surgical History:  Procedure Laterality Date  . TIBIA FRACTURE SURGERY Left 2004   rod and two screws due to trauma     Current Meds  Medication Sig  . acetaminophen (TYLENOL) 500 MG tablet Take 1,000 mg by mouth every 6 (six) hours as needed for mild pain, moderate pain, fever or headache.  . escitalopram (LEXAPRO) 10 MG tablet Take 1 tablet (10 mg total) by mouth daily.     Allergies:   Dimetapp c [phenylephrine-bromphen-codeine] and Ibuprofen   ROS:   Please see the history of present illness.    All other systems reviewed and are negative.   Labs/Other Tests and Data Reviewed:    Recent Labs: No results found  for requested labs within last 8760 hours.   Recent Lipid Panel Lab Results  Component Value Date/Time   CHOL 159 09/11/2014 03:55 PM   TRIG 239 (H) 09/11/2014 03:55 PM   HDL 39 (L) 09/11/2014 03:55 PM   CHOLHDL 4.1 09/11/2014 03:55 PM   LDLCALC 72 09/11/2014 03:55 PM    Wt Readings from Last 3 Encounters:  06/20/19 224 lb (101.6 kg)  05/24/19 224 lb 6.4 oz (101.8 kg)  04/11/19 219 lb 1.9 oz (99.4 kg)     Objective:     Vital Signs:  BP (!) 144/68   Ht 5\' 2"  (1.575 m)   Wt 224 lb (101.6 kg)   BMI 40.97 kg/m    VITAL SIGNS:  reviewed GEN:  alert and oriented RESPIRATORY:  no shortness of breath noted in conversation PSYCH:  normal affect and mood  ASSESSMENT & PLAN:    1. Sciatica of right side  - predniSONE (STERAPRED UNI-PAK 21 TAB) 10 MG (21) TBPK tablet; Take as directed  Dispense: 21 tablet; Refill: 0  Time:   Today, I have spent 10 minutes with the patient with telehealth technology discussing the above problems.     Medication Adjustments/Labs and Tests Ordered: Current medicines are reviewed at length with the patient today.  Concerns regarding medicines are outlined above.   Tests Ordered: No orders of the defined types were placed in this encounter.   Medication Changes: No orders of the defined types were placed in this encounter.   Disposition:  Follow up 09/19/2019  Signed, Perlie Mayo, NP  06/20/2019 9:55 AM     [facility]

## 2019-06-20 NOTE — Assessment & Plan Note (Signed)
S&S of sciatica. Never experienced before, will try pred dose pak and follow up if no improvement. Provided with sciatica exercises.   Reviewed side effects, risks and benefits of medication.   Patient acknowledged agreement and understanding of the plan.

## 2019-06-20 NOTE — Patient Instructions (Addendum)
I appreciate the opportunity to provide you with care for your health and wellness. Today we discussed: sciatica of right side   Follow up: 09/19/2019 as scheduled  No labs or referrals today  Take medication as directed  See below of exercises  Please continue to practice social distancing to keep you, your family, and our community safe.  If you must go out, please wear a mask and practice good handwashing.  It was a pleasure to see you and I look forward to continuing to work together on your health and well-being. Please do not hesitate to call the office if you need care or have questions about your care.  Have a wonderful day and week. With Gratitude, Cherly Beach, DNP, AGNP-BC   Sciatica Rehab Ask your health care provider which exercises are safe for you. Do exercises exactly as told by your health care provider and adjust them as directed. It is normal to feel mild stretching, pulling, tightness, or discomfort as you do these exercises. Stop right away if you feel sudden pain or your pain gets worse. Do not begin these exercises until told by your health care provider. Stretching and range-of-motion exercises These exercises warm up your muscles and joints and improve the movement and flexibility of your hips and back. These exercises also help to relieve pain, numbness, and tingling. Sciatic nerve glide 1. Sit in a chair with your head facing down toward your chest. Place your hands behind your back. Let your shoulders slump forward. 2. Slowly straighten one of your legs while you tilt your head back as if you are looking toward the ceiling. Only straighten your leg as far as you can without making your symptoms worse. 3. Hold this position for __10-20________ seconds. 4. Slowly return to the starting position. 5. Repeat with your other leg. Repeat ___2-3_______ times. Complete this exercise ___2_______ times a day. Knee to chest with hip adduction and internal  rotation  1. Lie on your back on a firm surface with both legs straight. 2. Bend one of your knees and move it up toward your chest until you feel a gentle stretch in your lower back and buttock. Then, move your knee toward the shoulder that is on the opposite side from your leg. This is hip adduction and internal rotation. ? Hold your leg in this position by holding on to the front of your knee. 3. Hold this position for 10-20__________ seconds. 4. Slowly return to the starting position. 5. Repeat with your other leg. Repeat _____2-3_____ times. Complete this exercise ____2______ times a day. Prone extension on elbows  1. Lie on your abdomen on a firm surface. A bed may be too soft for this exercise. 2. Prop yourself up on your elbows. 3. Use your arms to help lift your chest up until you feel a gentle stretch in your abdomen and your lower back. ? This will place some of your body weight on your elbows. If this is uncomfortable, try stacking pillows under your chest. ? Your hips should stay down, against the surface that you are lying on. Keep your hip and back muscles relaxed. 4. Hold this position for __10-20________ seconds. 5. Slowly relax your upper body and return to the starting position. Repeat __2-3________ times. Complete this exercise _2_________ times a day. Strengthening exercises These exercises build strength and endurance in your back. Endurance is the ability to use your muscles for a long time, even after they get tired. Pelvic tilt This exercise strengthens the  muscles that lie deep in the abdomen. 1. Lie on your back on a firm surface. Bend your knees and keep your feet flat on the floor. 2. Tense your abdominal muscles. Tip your pelvis up toward the ceiling and flatten your lower back into the floor. ? To help with this exercise, you may place a small towel under your lower back and try to push your back into the towel. 3. Hold this position for _10-20_________  seconds. 4. Let your muscles relax completely before you repeat this exercise. Repeat __2-3________ times. Complete this exercise __2________ times a day. Alternating arm and leg raises  1. Get on your hands and knees on a firm surface. If you are on a hard floor, you may want to use padding, such as an exercise mat, to cushion your knees. 2. Line up your arms and legs. Your hands should be directly below your shoulders, and your knees should be directly below your hips. 3. Lift your left leg behind you. At the same time, raise your right arm and straighten it in front of you. ? Do not lift your leg higher than your hip. ? Do not lift your arm higher than your shoulder. ? Keep your abdominal and back muscles tight. ? Keep your hips facing the ground. ? Do not arch your back. ? Keep your balance carefully, and do not hold your breath. 4. Hold this position for __10-20________ seconds. 5. Slowly return to the starting position. 6. Repeat with your right leg and your left arm. Repeat __2-3________ times. Complete this exercise __2________ times a day. Posture and body mechanics Good posture and healthy body mechanics can help to relieve stress in your body's tissues and joints. Body mechanics refers to the movements and positions of your body while you do your daily activities. Posture is part of body mechanics. Good posture means:  Your spine is in its natural S-curve position (neutral).  Your shoulders are pulled back slightly.  Your head is not tipped forward. Follow these guidelines to improve your posture and body mechanics in your everyday activities. Standing   When standing, keep your spine neutral and your feet about hip width apart. Keep a slight bend in your knees. Your ears, shoulders, and hips should line up.  When you do a task in which you stand in one place for a long time, place one foot up on a stable object that is 2-4 inches (5-10 cm) high, such as a footstool. This  helps keep your spine neutral. Sitting   When sitting, keep your spine neutral and keep your feet flat on the floor. Use a footrest, if necessary, and keep your thighs parallel to the floor. Avoid rounding your shoulders, and avoid tilting your head forward.  When working at a desk or a computer, keep your desk at a height where your hands are slightly lower than your elbows. Slide your chair under your desk so you are close enough to maintain good posture.  When working at a computer, place your monitor at a height where you are looking straight ahead and you do not have to tilt your head forward or downward to look at the screen. Resting  When lying down and resting, avoid positions that are most painful for you.  If you have pain with activities such as sitting, bending, stooping, or squatting, lie in a position in which your body does not bend very much. For example, avoid curling up on your side with your arms and knees  near your chest (fetal position).  If you have pain with activities such as standing for a long time or reaching with your arms, lie with your spine in a neutral position and bend your knees slightly. Try the following positions: ? Lying on your side with a pillow between your knees. ? Lying on your back with a pillow under your knees. Lifting   When lifting objects, keep your feet at least shoulder width apart and tighten your abdominal muscles.  Bend your knees and hips and keep your spine neutral. It is important to lift using the strength of your legs, not your back. Do not lock your knees straight out.  Always ask for help to lift heavy or awkward objects. This information is not intended to replace advice given to you by your health care provider. Make sure you discuss any questions you have with your health care provider. Document Revised: 06/10/2018 Document Reviewed: 03/10/2018 Elsevier Patient Education  [employer].

## 2019-07-05 ENCOUNTER — Other Ambulatory Visit: Payer: Self-pay | Admitting: Family Medicine

## 2019-07-05 DIAGNOSIS — Z789 Other specified health status: Secondary | ICD-10-CM

## 2019-07-10 DIAGNOSIS — Z789 Other specified health status: Secondary | ICD-10-CM | POA: Diagnosis not present

## 2019-07-11 ENCOUNTER — Encounter: Payer: Self-pay | Admitting: Family Medicine

## 2019-07-11 LAB — VARICELLA ZOSTER ANTIBODY, IGG: Varicella IgG: 967.9 index

## 2019-07-11 LAB — MEASLES/MUMPS/RUBELLA IMMUNITY
Mumps IgG: 75.1 AU/mL
Rubella: 1.43 Index
Rubeola IgG: 139 AU/mL

## 2019-09-19 ENCOUNTER — Encounter: Payer: Medicaid Other | Admitting: Family Medicine

## 2019-10-03 ENCOUNTER — Other Ambulatory Visit: Payer: Self-pay

## 2019-10-03 ENCOUNTER — Ambulatory Visit (INDEPENDENT_AMBULATORY_CARE_PROVIDER_SITE_OTHER): Payer: Medicaid Other | Admitting: Family Medicine

## 2019-10-03 ENCOUNTER — Encounter: Payer: Self-pay | Admitting: Family Medicine

## 2019-10-03 VITALS — BP 138/82 | HR 80 | Temp 97.5°F | Resp 18 | Ht 63.0 in | Wt 232.1 lb

## 2019-10-03 DIAGNOSIS — Z0001 Encounter for general adult medical examination with abnormal findings: Secondary | ICD-10-CM

## 2019-10-03 DIAGNOSIS — Z6841 Body Mass Index (BMI) 40.0 and over, adult: Secondary | ICD-10-CM

## 2019-10-03 DIAGNOSIS — Z1231 Encounter for screening mammogram for malignant neoplasm of breast: Secondary | ICD-10-CM

## 2019-10-03 DIAGNOSIS — E559 Vitamin D deficiency, unspecified: Secondary | ICD-10-CM

## 2019-10-03 DIAGNOSIS — I10 Essential (primary) hypertension: Secondary | ICD-10-CM | POA: Diagnosis not present

## 2019-10-03 DIAGNOSIS — F32A Depression, unspecified: Secondary | ICD-10-CM

## 2019-10-03 DIAGNOSIS — E782 Mixed hyperlipidemia: Secondary | ICD-10-CM

## 2019-10-03 DIAGNOSIS — F329 Major depressive disorder, single episode, unspecified: Secondary | ICD-10-CM

## 2019-10-03 DIAGNOSIS — E781 Pure hyperglyceridemia: Secondary | ICD-10-CM

## 2019-10-03 NOTE — Assessment & Plan Note (Signed)
Has worsened possibly related to weight gain.  Encouraged heart healthy low-salt diet.  And exercise along with lifestyle and diet changes.  Will be getting updated labs.  I have suggested being on something low-dose to help with this.  However she would like to wait at this time.

## 2019-10-03 NOTE — Assessment & Plan Note (Signed)
Will be getting updated labs and treating as appropriate.  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

## 2019-10-03 NOTE — Assessment & Plan Note (Signed)
Overall doing well.  Denies having any SI or HI.  PHQ has improved.  We will continue Lexapro at this time.

## 2019-10-03 NOTE — Assessment & Plan Note (Signed)
Discussed monthly self breast exams and yearly mammograms; at least 30 minutes of aerobic activity at least 5 days/week and weight-bearing exercise 2x/week; proper sunscreen use reviewed; healthy diet, including goals of calcium and vitamin D intake and alcohol recommendations (less than or equal to 1 drink/day) reviewed; regular seatbelt use; changing batteries in smoke detectors.  Immunization recommendations discussed.  Colonoscopy recommendations reviewed.  

## 2019-10-03 NOTE — Progress Notes (Signed)
Health Maintenance reviewed -   Immunization History  Administered Date(s) Administered  . PPD Test 04/25/2019  . Td 06/01/2002  . Tdap 09/11/2014  . Tetanus 08/15/2014   Last Pap smear: setting up appt  Last mammogram: ordered today Last colonoscopy: n/a Last DEXA: n/a Dentist: needs appt  Ophtho: needs appt  Exercise: none Smoker: no  Alcohol Use: no   Other doctors caring for patient include:  Patient Care Team: Perlie Mayo, NP as PCP - General (Family Medicine)  End of Life Discussion:  Patient does not have a living will and medical power of attorney  Subjective:   HPI  Tracy Rojas is a 44 y.o. female who presents for annual wellness visit and follow-up on chronic medical conditions.  She has the following concerns: weight has been increasing.   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  Does not get the best food choices as she discusses with me today in office.  But would like to try to get her weight back better control.  Denies having any trouble with chewing swallowing or appetite or dentition changes.  Does need to get to a dentist.  Denies having any changes in bowel or bladder habits.  Denies having blood in urine or stool.  Colonoscopy due in 8 years.  Denies having any memory issues but does report that when she does not get good sleep she feels kind of foggy and forgetful.  Denies having any falls or injuries.  Denies having any skin issues.  Denies having any hearing issues.  Has not seen the eye doctor in some time.  Does not have any vision problems today.  Has had an increase in weight and blood pressure. Denies having any cough fevers chills shortness of breath headaches vision changes, some mild leg swelling predominantly on the left secondary to having knee surgery.  Due for mammogram, ordered today.,  Has GYN will get Pap smear set up through them.  Of note has not had Covid days as of yet.  Is considering  this.   Review Of Systems  Review of Systems  Constitutional: Negative.   HENT: Negative.   Eyes: Negative.   Respiratory: Negative.   Cardiovascular: Negative.   Gastrointestinal: Negative.   Endocrine: Negative.   Genitourinary: Negative.   Musculoskeletal: Negative.   Skin: Negative.   Allergic/Immunologic: Negative.   Neurological: Negative.   Hematological: Negative.   Psychiatric/Behavioral: Negative.   All other systems reviewed and are negative.     Objective:   PHYSICAL EXAM:  BP 138/82 (BP Location: Right Arm, Patient Position: Sitting, Cuff Size: Normal)   Pulse 80   Temp (!) 97.5 F (36.4 C) (Temporal)   Resp 18   Ht 5\' 3"  (1.6 m)   Wt 232 lb 1.9 oz (105.3 kg)   BMI 41.12 kg/m   Physical Exam Vitals and nursing note reviewed.  Constitutional:      Appearance: Normal appearance. She is well-developed and well-groomed. She is morbidly obese.  HENT:     Head: Normocephalic.     Right Ear: Hearing, tympanic membrane, ear canal and external ear normal.     Left Ear: Hearing, tympanic membrane, ear canal and external ear normal.     Nose: Nose normal.     Mouth/Throat:     Lips: Pink.     Mouth: Mucous membranes are moist.     Pharynx: Oropharynx is clear. Uvula midline.  Eyes:  General: Lids are normal.     Extraocular Movements: Extraocular movements intact.     Conjunctiva/sclera: Conjunctivae normal.     Pupils: Pupils are equal, round, and reactive to light.  Neck:     Thyroid: No thyroid mass, thyromegaly or thyroid tenderness.     Vascular: No carotid bruit.  Cardiovascular:     Rate and Rhythm: Normal rate and regular rhythm.     Pulses: Normal pulses.          Radial pulses are 2+ on the right side and 2+ on the left side.       Dorsalis pedis pulses are 2+ on the right side and 2+ on the left side.       Posterior tibial pulses are 2+ on the right side and 2+ on the left side.     Heart sounds: Normal heart sounds.  Pulmonary:      Effort: Pulmonary effort is normal.     Breath sounds: Normal breath sounds and air entry.  Abdominal:     General: Abdomen is flat. Bowel sounds are normal.     Palpations: Abdomen is soft.     Tenderness: There is no abdominal tenderness. There is no right CVA tenderness or left CVA tenderness.     Hernia: No hernia is present.  Musculoskeletal:        General: Normal range of motion.     Cervical back: Full passive range of motion without pain, normal range of motion and neck supple.     Right lower leg: No edema.     Left lower leg: No edema.     Comments: MAE, ROM intact  Lymphadenopathy:     Cervical: No cervical adenopathy.  Skin:    General: Skin is warm and dry.     Capillary Refill: Capillary refill takes less than 2 seconds.  Neurological:     General: No focal deficit present.     Mental Status: She is alert and oriented to person, place, and time. Mental status is at baseline.     Cranial Nerves: Cranial nerves are intact.     Sensory: Sensation is intact.     Motor: Motor function is intact.     Coordination: Coordination is intact.     Gait: Gait is intact.     Deep Tendon Reflexes: Reflexes are normal and symmetric.  Psychiatric:        Attention and Perception: Attention and perception normal.        Mood and Affect: Mood and affect normal.        Speech: Speech normal.        Behavior: Behavior normal. Behavior is cooperative.        Thought Content: Thought content normal.        Cognition and Memory: Cognition and memory normal.        Judgment: Judgment normal.     Depression Screening  Depression screen [facility] 2/9 10/03/2019 05/02/2019 04/11/2019 09/14/2018 09/06/2018  Decreased Interest 0 0 0 0 -  Down, Depressed, Hopeless 0 3 0 0 0  PHQ - 2 Score 0 3 0 0 0  Altered sleeping 0 1 - - -  Tired, decreased energy 0 3 - - -  Change in appetite 2 3 - - -  Feeling bad or failure about yourself  0 0 - - -  Trouble concentrating 1 3 - - -  Moving slowly or  fidgety/restless 0 0 - - -  Suicidal thoughts 0  0 - - -  PHQ-9 Score 3 13 - - -  Difficult doing work/chores Not difficult at all - - - -     Falls  Fall Risk  10/03/2019 06/20/2019 05/02/2019 04/11/2019 09/14/2018  Falls in the past year? 0 1 0 1 0  Number falls in past yr: 0 0 0 0 0  Injury with Fall? 0 1 0 1 0  Risk for fall due to : No Fall Risks - - - -  Follow up Falls evaluation completed - - - -    Assessment & Plan:   1. Annual visit for general adult medical examination with abnormal findings   2. Morbid obesity with body mass index (BMI) of 40.0 to 44.9 in adult ([place])   3. Isolated hypertriglyceridemia   4. Essential hypertension   5. Screening mammogram, encounter for   6. Vitamin D deficiency   7. Depression, unspecified depression type     Tests ordered Orders Placed This Encounter  Procedures  . MM Digital Screening  . CBC  . Comprehensive metabolic panel  . Hemoglobin A1c  . Lipid panel  . TSH  . VITAMIN D 25 Hydroxy (Vit-D Deficiency, Fractures)     Plan: Please see assessment and plan per problem list above.   No orders of the defined types were placed in this encounter.   I have personally reviewed: The patient's medical and social history Their use of alcohol, tobacco or illicit drugs Their current medications and supplements The patient's functional ability including ADLs,fall risks, home safety risks, cognitive, and hearing and visual impairment Diet and physical activities Evidence for depression or mood disorders  The patient's weight, height, BMI, and visual acuity have been recorded in the chart.  I have made referrals, counseling, and provided education to the patient based on review of the above and I have provided the patient with a written personalized care plan for preventive services.    Note: This dictation was prepared with Dragon dictation along with smaller phrase technology. Similar sounding words can be transcribed inadequately or  may not be corrected upon review. Any transcriptional errors that result from this process are unintentional.      Perlie Mayo, NP   10/03/2019

## 2019-10-03 NOTE — Assessment & Plan Note (Signed)
Heart healthy low-fat diet encouraged.  Updated labs ordered.

## 2019-10-03 NOTE — Patient Instructions (Addendum)
I appreciate the opportunity to provide you with care for your health and wellness. Today we discussed: overall health   Follow up: 4 months for weight check in  Labs-fasting this week No referrals today Orders: Mammogram  Slow, steady changes in lifestyle and diet will help with weight.  Please continue to practice social distancing to keep you, your family, and our community safe.  If you must go out, please wear a mask and practice good handwashing.  It was a pleasure to see you and I look forward to continuing to work together on your health and well-being. Please do not hesitate to call the office if you need care or have questions about your care.  Have a wonderful day and week. With Gratitude, Cherly Beach, DNP, AGNP-BC

## 2019-10-03 NOTE — Assessment & Plan Note (Addendum)
Linked to hypertension, hypertriglyceridemia, Deteriorated, she is educated about the importance of daily exercise to help with weight management in addition to making good food choices and portion control.   Wt Readings from Last 3 Encounters:  10/03/19 232 lb 1.9 oz (105.3 kg)  06/20/19 224 lb (101.6 kg)  05/24/19 224 lb 6.4 oz (101.8 kg)

## 2019-10-08 ENCOUNTER — Encounter: Payer: Self-pay | Admitting: Family Medicine

## 2019-10-16 ENCOUNTER — Encounter: Payer: Self-pay | Admitting: Family Medicine

## 2019-10-16 ENCOUNTER — Other Ambulatory Visit: Payer: Self-pay

## 2019-10-16 DIAGNOSIS — F329 Major depressive disorder, single episode, unspecified: Secondary | ICD-10-CM

## 2019-10-16 DIAGNOSIS — F32A Depression, unspecified: Secondary | ICD-10-CM

## 2019-10-16 MED ORDER — ESCITALOPRAM OXALATE 10 MG PO TABS: 10.0000 mg | ORAL_TABLET | Freq: Every day | ORAL | 1 refills | Status: DC

## 2019-10-20 ENCOUNTER — Ambulatory Visit
Admission: RE | Admit: 2019-10-20 | Discharge: 2019-10-20 | Disposition: A | Discharge status: Home / Self Care | Payer: Medicaid Other | Source: Ambulatory Visit | Attending: Family Medicine | Admitting: Family Medicine

## 2019-10-20 ENCOUNTER — Other Ambulatory Visit: Payer: Self-pay

## 2019-10-20 DIAGNOSIS — Z1231 Encounter for screening mammogram for malignant neoplasm of breast: Secondary | ICD-10-CM | POA: Diagnosis not present

## 2020-02-06 ENCOUNTER — Encounter: Payer: Self-pay | Admitting: Family Medicine

## 2020-02-06 ENCOUNTER — Other Ambulatory Visit: Payer: Self-pay

## 2020-02-06 ENCOUNTER — Ambulatory Visit (INDEPENDENT_AMBULATORY_CARE_PROVIDER_SITE_OTHER): Payer: Medicaid Other | Admitting: Family Medicine

## 2020-02-06 VITALS — BP 132/74 | HR 112 | Temp 98.6°F | Ht 63.0 in | Wt 231.0 lb

## 2020-02-06 DIAGNOSIS — Z6841 Body Mass Index (BMI) 40.0 and over, adult: Secondary | ICD-10-CM | POA: Diagnosis not present

## 2020-02-06 DIAGNOSIS — I10 Essential (primary) hypertension: Secondary | ICD-10-CM | POA: Diagnosis not present

## 2020-02-06 DIAGNOSIS — F32A Depression, unspecified: Secondary | ICD-10-CM

## 2020-02-06 NOTE — Assessment & Plan Note (Signed)
Denies having any SI or HI.  Overall PHQ is improved.  She stopped taking the Lexapro about a month ago.  Would like to stay off of it at this time.  Reports that her good days are more than her bad days at this time.

## 2020-02-06 NOTE — Assessment & Plan Note (Addendum)
Overall in controlled range higher level of normal.  Encouraged low-salt diet heart healthy diet.  Exercise as well 150 mins week

## 2020-02-06 NOTE — Progress Notes (Signed)
Subjective:  Patient ID: Tracy Rojas, female    DOB: 01/02/1976  Age: 44 y.o. MRN: [id number]  CC:  Chief Complaint  Patient presents with  . Weight Check    f/u      HPI  HPI  Tracy Rojas is a 44 year old female patient who presents today for follow-up on weight.  She reports overall that she is doing well she declines having any other issues or concerns to discuss today.  She reports that she would like to get help on her diet as she has been unable to do much with the lifestyle and diet changes that she has performed over the last 4 months.  She reports that she drinks regular Dr. 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  She has switched out and started eating nuts instead of sweet snacks.  She has advised many snacks for the home as well.  She eats cheese and cracker packets instead of little snack cakes.  She reports she doesn't eat breakfast when she gets home she does go straight to bed.  Will wake up and sometimes will eat a waffle and/or premade soup.  She is not currently doing any form of exercise outside of work.  She denies having any headaches or dizziness or vision changes, denies having any chest pain, cough, shortness of breath.   Of note she stopped taking her Lexapro about a month or so ago because she said something about insurance not covering it anymore.  She would like to stay off of it because she reports that her good days are more than her bad days and overall she is feeling really well right now.  Today patient denies signs and symptoms of COVID 19 infection including fever, chills, cough, shortness of breath, and headache. Past Medical, Surgical, Social History, Allergies, and Medications have been Reviewed.   Past Medical History:  Diagnosis Date  . Acute cystitis with hematuria 05/24/2019  . Advanced maternal age in multigravida 05/17/2015  . AMA (advanced maternal age) multigravida 71+   . Bronchitis 04/2012  . Finger mass,  left 04/11/2019  . 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 GERD (gastroesophageal reflux disease)   . Headache(784.0)   . History of pre-eclampsia in prior pregnancy, currently pregnant   . Impaired glucose tolerance in pregnancy 05/17/2015  . Migraine without aura 12/13/2012  . Mild preeclampsia 05/16/2015  . Normal pregnancy in multigravida in third trimester 10/01/2016  . Normal pregnancy in multigravida in third trimester 10/01/2016  . Normal vaginal delivery 05/17/2015  . SVD (spontaneous vaginal delivery) 10/05/2016  . Tension headache 12/13/2012  . Thrombocytopenia ([place]) 02/17/2012    Current Meds  Medication Sig  . acetaminophen (TYLENOL) 500 MG tablet Take 1,000 mg by mouth every 6 (six) hours as needed for mild pain, moderate pain, fever or headache.    ROS:  Review of Systems  Constitutional: Negative.   HENT: Negative.   Eyes: Negative.   Respiratory: Negative.   Cardiovascular: Negative.   Gastrointestinal: Negative.   Genitourinary: Negative.   Musculoskeletal: Negative.   Skin: Negative.   Neurological: Negative.   Endo/Heme/Allergies: Negative.   Psychiatric/Behavioral: Negative.      Objective:   Today's Vitals: BP 132/74 (BP Location: Right Arm, Patient Position: Sitting, Cuff Size: Normal)   Pulse (!) 112   Temp 98.6 F (37 C) (Temporal)   Ht 5\' 3"  (1.6 m)   Wt 231 lb (104.8 kg)   LMP 01/23/2020   SpO2  98%   BMI 40.92 kg/m  Vitals with BMI 02/06/2020 10/03/2019 06/20/2019  Height 5\' 3"  5\' 3"  5\' 2"   Weight 231 lbs 232 lbs 2 oz 224 lbs  BMI 40.93 88.50 27.74  Systolic 128 786 767  Diastolic 74 82 68  Pulse 209 80 -     Physical Exam Vitals and nursing note reviewed.  Constitutional:      Appearance: Normal appearance. She is well-developed and well-groomed. She is morbidly obese.  HENT:     Head: Normocephalic and atraumatic.     Right Ear: External ear normal.     Left Ear: External ear normal.     Mouth/Throat:     Comments: Mask in place  Eyes:     General:         Right eye: No discharge.        Left eye: No discharge.     Conjunctiva/sclera: Conjunctivae normal.  Cardiovascular:     Rate and Rhythm: Normal rate and regular rhythm.     Pulses: Normal pulses.     Heart sounds: Normal heart sounds.  Pulmonary:     Effort: Pulmonary effort is normal.     Breath sounds: Normal breath sounds.  Musculoskeletal:        General: Normal range of motion.     Cervical back: Normal range of motion and neck supple.  Skin:    General: Skin is warm.  Neurological:     General: No focal deficit present.     Mental Status: She is alert and oriented to person, place, and time.  Psychiatric:        Attention and Perception: Attention normal.        Mood and Affect: Mood normal.        Speech: Speech normal.        Behavior: Behavior normal. Behavior is cooperative.        Thought Content: Thought content normal.        Cognition and Memory: Cognition normal.        Judgment: Judgment normal.    Depression screen [facility] 2/9 02/06/2020 10/03/2019 05/02/2019 04/11/2019 09/14/2018  Decreased Interest 0 0 0 0 0  Down, Depressed, Hopeless 1 0 3 0 0  PHQ - 2 Score 1 0 3 0 0  Altered sleeping - 0 1 - -  Tired, decreased energy - 0 3 - -  Change in appetite - 2 3 - -  Feeling bad or failure about yourself  - 0 0 - -  Trouble concentrating - 1 3 - -  Moving slowly or fidgety/restless - 0 0 - -  Suicidal thoughts - 0 0 - -  PHQ-9 Score - 3 13 - -  Difficult doing work/chores - Not difficult at all - - -     Assessment   1. Morbid obesity with body mass index (BMI) of 40.0 to 44.9 in adult [facility])   2. Primary hypertension   3. Depression, unspecified depression type     Tests ordered Orders Placed This Encounter  Procedures  . Amb Referral to Nutrition and Diabetic E     Plan: Please see assessment and plan per problem list above.   No orders of the defined types were placed in this encounter.   Patient to follow-up in 05/07/2020   Note: This  dictation was prepared with Dragon dictation along with smaller phrase technology. Similar sounding words can be transcribed inadequately or may not be corrected upon review. Any transcriptional  errors that result from this process are unintentional.      Perlie Mayo, NP

## 2020-02-06 NOTE — Patient Instructions (Signed)
I appreciate the opportunity to provide you with care for your health and wellness. Today we discussed: weight   Follow up: 3 months for wt check in   No labs  Referrals today: Nutritionist/  Dietician   See attached information for more review.  Please continue to practice social distancing to keep you, your family, and our community safe.  If you must go out, please wear a mask and practice good handwashing.  It was a pleasure to see you and I look forward to continuing to work together on your health and well-being. Please do not hesitate to call the office if you need care or have questions about your care.  Have a wonderful day. With Gratitude, Cherly Beach, DNP, AGNP-BC   Reading Food Labels  Foods that are in packages or containers have a Nutrition Facts panel on the side or back. This is commonly called the food label. The food label helps you to make informed food choices by providing information about serving size and the amount of calories and various nutrients in the food. You can check the food label to find out if the food contains high or low amounts of items that you want to limit in your diet. You can also use the food label to see if the food is a good source of the nutrients that you want to include in your diet. How do I read the food label?  Start by looking at the serving size and servings per package.  Check the calories.  Check the amount of fat, cholesterol, and sodium. Try to limit these nutrients.  Check the amount of dietary fiber, protein, and other vitamins and minerals listed. Depending on recommendations from your health care provider or dietitian, certain values may be more important to your overall health and diet than others.  Look at the ingredient list. Depending on your dietary needs, you may need to avoid foods with certain ingredients. Talk to your health care provider or dietitian about what ingredients you should watch for. What does the  information on the food label mean? Serving size  This indicates the amount of the food that makes up one serving. All of the nutrition information listed on the food label is based on one serving.  Serving size may be based on: ? The number of food pieces. ? The volume of food (cups, fluid ounces, tablespoons). ? The weight of food (grams, ounces).  The label will also indicate how many servings are in one package. If you eat more than one serving, you must multiply the amounts (such as calories, grams of saturated fat, or milligrams of sodium) by the number of servings. Calories  Calories are a measure of the amount of energy that your body gets from the food.  Calories from fat is a measure of the amount of energy in the food that comes from fats.  Most foods list only the calories in one serving of food. Some foods may list the number of calories per package if one package contains slightly more than one serving.  Counting total daily calories is one way that is used to help manage weight.  Talk to your health care provider or dietitian about how many calories you should eat each day. Percent daily value  Percent daily value (% Daily Value) tells you what percent of the daily value for each nutrient one serving provides. The daily value is the recommended total amount of the item that you should get each day.  For example, if 15% is listed next to dietary fiber, it means that one serving of the food will give you 15% of the recommended amount of fiber that you should get in a day. The daily values are based on a 2,000-calorie-per-day diet. You may get more or less than 2,000 calories in your diet each day, but the % Daily Value gives you an idea of whether the food contains a high or low amount of the listed item. ? 5 percent daily value (%DV) or less means there is a low amount of a nutrient in one serving. ? 20 percent daily value (%DV) or higher means there is a high amount of a  nutrient in one serving. Total fat  Total fat shows you the number of grams (g) of fat in one serving. Two of the fats that make up a portion of the total fat are included on the label: ? Saturated fat. The food label shows both the amount of fat in grams (g) and the percent daily value per serving. This type of fat increases the amount of blood cholesterol. If you eat 2,000 calories each day, you should eat less than 13 g of saturated fat each day. ? Trans fat. The food label shows the number of grams (g) per serving. This type of fat is the most unhealthy fat for heart health. It is recommended that people limit their intake of trans fat to as little as possible. Look for foods that have "0g Trans Fat" on the label. Cholesterol  Cholesterol tells you the number of milligrams (mg) and the percent daily value of cholesterol in one serving. Cholesterol is a fat-like substance. It can be harmful if you eat too much of it. If you eat 2,000 calories each day, you should eat less than 300 mg of cholesterol each day. Sodium  Sodium tells you the number of milligrams (mg) and the percent daily value of salt in one serving. If eaten in large amounts, sodium can raise your blood pressure. Most people should limit their sodium intake to 2,300 mg a day. Total carbohydrate  Total carbohydrate shows you the number of grams (g) of carbohydrates in one serving. Two types of carbohydrates make up the total carbohydrates included on the label: ? Dietary fiber. The food label shows both the amount of dietary fiber in grams (g) and the percent daily value per serving. Most people should eat at least 25 g of dietary fiber each day. ? Sugars. The food label shows the number of grams (g) of sugars per serving. This value includes both naturally occurring sugars from fruit and milk and added sugars, such as honey or table sugar. Protein  Protein tells you how many grams (g) of protein are in one serving. The recommended  amount of daily protein differs for men and women, and it may depend on your overall health. Talk to your health care provider or dietitian about how much protein you should eat each day. Vitamins and minerals  The food label shows the percent daily value (%DV) for vitamins and minerals such as vitamin A, vitamin C, calcium, and iron. Other vitamins and minerals may be listed depending on the food. Ingredients  Food labels list each ingredient in the food. The ingredients are listed in the order of their amount by weight from most to least.  Food labels may also include a warning about ingredients that can cause allergic reactions in some people. These may be indicated by the words "Contains"  or "May contain." Examples of ingredients that may be listed are wheat, dairy, eggs, soy, and nuts. If a person knows that he or she is allergic to one of these ingredients, he or she will know to avoid that food. Where to find more information  U.S. Food and Drug Administration: [employer] Summary  The food label is the common term for the Nutrition Facts panel on the side or back of food packages or containers.  The food label helps you to make informed food choices by providing information about serving size and the amount of calories and various nutrients in the food.  To read the food label, begin by checking the serving size and number of servings in the container. Then check the calories and the amount of each listed item. This information is not intended to replace advice given to you by your health care provider. Make sure you discuss any questions you have with your health care provider. Document Revised: 06/26/2016 Document Reviewed: 03/24/2016 Elsevier Patient Education  [place].

## 2020-02-06 NOTE — Assessment & Plan Note (Addendum)
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  Continuing to avoid snacks.  Would like help with meal planning, reading of labels in general just knowing overall how much she should eat of certain foods daily.  Does not feel like starting medication at this time.

## 2020-03-11 ENCOUNTER — Other Ambulatory Visit: Payer: Self-pay

## 2020-03-11 ENCOUNTER — Telehealth (INDEPENDENT_AMBULATORY_CARE_PROVIDER_SITE_OTHER): Payer: Medicaid Other | Admitting: Internal Medicine

## 2020-03-11 ENCOUNTER — Encounter: Payer: Self-pay | Admitting: Internal Medicine

## 2020-03-11 VITALS — BP 128/91 | HR 111 | Temp 98.1°F

## 2020-03-11 DIAGNOSIS — Z20822 Contact with and (suspected) exposure to covid-19: Secondary | ICD-10-CM | POA: Diagnosis not present

## 2020-03-11 DIAGNOSIS — Z7189 Other specified counseling: Secondary | ICD-10-CM | POA: Diagnosis not present

## 2020-03-11 NOTE — Progress Notes (Signed)
Virtual Visit via Telephone Note   This visit type was conducted due to national recommendations for restrictions regarding the COVID-19 Pandemic (e.g. social distancing) in an effort to limit this patient's exposure and mitigate transmission in our community.  Due to her co-morbid illnesses, this patient is at least at moderate risk for complications without adequate follow up.  This format is felt to be most appropriate for this patient at this time.  The patient did not have access to video technology/had technical difficulties with video requiring transitioning to audio format only (telephone).  All issues noted in this document were discussed and addressed.  No physical exam could be performed with this format.   Evaluation Performed:  Follow-up visit  Date:  03/11/2020   ID:  Tracy Rojas, DOB 01-10-1976, MRN [id number]  Patient Location: Home Provider Location: Office/Clinic  Participants: Patient Location of Patient: Home Location of Provider: Telehealth Consent was obtain for visit to be over via telehealth. I verified that I am speaking with the correct person using two identifiers.  PCP:  Perlie Mayo, NP   Chief Complaint:  Cough and headache  History of Present Illness:    Tracy Rojas is a 45 y.o. female has a televisit for c/o cough and headache for the last 3 days.  She also c/o nasal congestion and myalgias. She denies any dyspnea or wheezing. Denies any known sick contacts. Has not been vaccinated against COVID.  The patient does have symptoms concerning for COVID-19 infection (fever, chills, cough, or new shortness of breath).   Past Medical, Surgical, Social History, Allergies, and Medications have been Reviewed.  Past Medical History:  Diagnosis Date  . Acute cystitis with hematuria 05/24/2019  . Advanced maternal age in multigravida 05/17/2015  . AMA (advanced maternal age) multigravida 50+   . Bronchitis 04/2012  . Finger mass, left  04/11/2019  . 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 GERD (gastroesophageal reflux disease)   . Headache(784.0)   . History of pre-eclampsia in prior pregnancy, currently pregnant   . Impaired glucose tolerance in pregnancy 05/17/2015  . Migraine without aura 12/13/2012  . Mild preeclampsia 05/16/2015  . Normal pregnancy in multigravida in third trimester 10/01/2016  . Normal pregnancy in multigravida in third trimester 10/01/2016  . Normal vaginal delivery 05/17/2015  . SVD (spontaneous vaginal delivery) 10/05/2016  . Tension headache 12/13/2012  . Thrombocytopenia ([place]) 02/17/2012   Past Surgical History:  Procedure Laterality Date  . TIBIA FRACTURE SURGERY Left 2004   rod and two screws due to trauma     Current Meds  Medication Sig  . acetaminophen (TYLENOL) 500 MG tablet Take 1,000 mg by mouth every 6 (six) hours as needed for mild pain, moderate pain, fever or headache.     Allergies:   Dimetapp c [phenylephrine-bromphen-codeine] and Ibuprofen   ROS:   Please see the history of present illness.     All other systems reviewed and are negative.   Labs/Other Tests and Data Reviewed:    Recent Labs: No results found for requested labs within last 8760 hours.   Recent Lipid Panel Lab Results  Component Value Date/Time   CHOL 159 09/11/2014 03:55 PM   TRIG 239 (H) 09/11/2014 03:55 PM   HDL 39 (L) 09/11/2014 03:55 PM   CHOLHDL 4.1 09/11/2014 03:55 PM   LDLCALC 72 09/11/2014 03:55 PM    Wt Readings from Last 3 Encounters:  02/06/20 231 lb (104.8 kg)  10/03/19 232 lb 1.9 oz (105.3  kg)  06/20/19 224 lb (101.6 kg)      ASSESSMENT & PLAN:    Suspected COVID-19 infection Tylenol PRN for fever, chills, myalgias Mucinex PRN for cough Scheduled for COVID RTPCR Advised to continue to self-quarantine even while waiting for test results Educated about progression of viral infection  Time:   Today, I have spent 15 minutes reviewing the chart, including problem list, medications, and with  the patient with telehealth technology discussing the above problems.   Medication Adjustments/Labs and Tests Ordered: Current medicines are reviewed at length with the patient today.  Concerns regarding medicines are outlined above.   Tests Ordered: No orders of the defined types were placed in this encounter.   Medication Changes: No orders of the defined types were placed in this encounter.    Note: This dictation was prepared with Dragon dictation along with smaller phrase technology. Similar sounding words can be transcribed inadequately or may not be corrected upon review. Any transcriptional errors that result from this process are unintentional.      Disposition:  Follow up  Signed, Lindell Spar, MD  03/11/2020 2:22 PM     [facility]

## 2020-03-11 NOTE — Patient Instructions (Signed)
Please get tested for COVID as scheduled.  Please continue to self quarantine even while test results are pending.  Okay to take Tylenol for fever, chills, or muscle aches.  Please take Mucinex or Robitussin for cough.  If you have shortness of breath or chest pain or palpitations, please go to ER immediately.

## 2020-03-13 ENCOUNTER — Ambulatory Visit: Payer: Medicaid Other | Admitting: Nutrition

## 2020-03-15 ENCOUNTER — Other Ambulatory Visit: Payer: Medicaid Other

## 2020-03-15 DIAGNOSIS — Z20822 Contact with and (suspected) exposure to covid-19: Secondary | ICD-10-CM

## 2020-03-17 LAB — NOVEL CORONAVIRUS, NAA: SARS-CoV-2, NAA: DETECTED — AB

## 2020-03-17 LAB — SARS-COV-2, NAA 2 DAY TAT

## 2020-03-18 ENCOUNTER — Telehealth: Payer: Self-pay

## 2020-03-18 NOTE — Telephone Encounter (Signed)
Called to discuss with patient about COVID-19 symptoms and the use of one of the available treatments for those with mild to moderate Covid symptoms and at a high risk of hospitalization.  Pt appears to qualify for outpatient treatment due to co-morbid conditions and/or a member of an at-risk group in accordance with the FDA Emergency Use Authorization.    Symptom onset: 03/08/20 Vaccinated: Unknown Booster? Unknown Immunocompromised? No Qualifiers: No  Unable to reach pt - Voice  mailbox not set up.  Pt.out of window for treatment.  Tracy Rojas

## 2020-04-01 ENCOUNTER — Encounter: Payer: Self-pay | Admitting: Family Medicine

## 2020-04-02 ENCOUNTER — Ambulatory Visit: Payer: Medicaid Other

## 2020-04-02 ENCOUNTER — Encounter: Payer: Self-pay | Admitting: Family Medicine

## 2020-04-05 ENCOUNTER — Other Ambulatory Visit: Payer: Self-pay

## 2020-04-05 ENCOUNTER — Ambulatory Visit (INDEPENDENT_AMBULATORY_CARE_PROVIDER_SITE_OTHER): Payer: Medicaid Other

## 2020-04-05 DIAGNOSIS — Z111 Encounter for screening for respiratory tuberculosis: Secondary | ICD-10-CM | POA: Diagnosis not present

## 2020-04-08 ENCOUNTER — Encounter: Payer: Medicaid Other | Attending: Family Medicine | Admitting: Nutrition

## 2020-04-08 ENCOUNTER — Other Ambulatory Visit: Payer: Self-pay

## 2020-04-08 DIAGNOSIS — Z6841 Body Mass Index (BMI) 40.0 and over, adult: Secondary | ICD-10-CM | POA: Insufficient documentation

## 2020-04-08 DIAGNOSIS — I10 Essential (primary) hypertension: Secondary | ICD-10-CM | POA: Insufficient documentation

## 2020-04-08 LAB — TB SKIN TEST
Induration: 0 mm
TB Skin Test: NEGATIVE

## 2020-04-08 NOTE — Progress Notes (Signed)
Medical Nutrition Therapy   This visit was completed via telephone due to the COVID-19 pandemic.   I spoke with Tracy Rojas and verified that I was speaking with the correct person with two patient identifiers (full name and date of birth).   I discussed the limitations related to this kind of visit and the patient is willing to proceed   Appointment Start time: [name]  Appointment End time:  1645 Primary concerns today: Morbid Obesity  Referral diagnosis: e66.01 Preferred learning style:  no preference indicated Learning readiness:  change in progress   NUTRITION ASSESSMENT   Anthropometrics  Wt Readings from Last 3 Encounters:  02/06/20 231 lb (104.8 kg)  10/03/19 232 lb 1.9 oz (105.3 kg)  06/20/19 224 lb (101.6 kg)   Ht Readings from Last 3 Encounters:  02/06/20 5\' 3"  (1.6 m)  10/03/19 5\' 3"  (1.6 m)  06/20/19 5\' 2"  (1.575 m)   There is no height or weight on file to calculate BMI. @BMIFA @ Facility age limit for growth percentiles is 20 years. Facility age limit for growth percentiles is 20 years.  Clinical Medical Hx: HTN, Vit D deficiency, Morbid obesity Medications: none Labs: CMP Latest Ref Rng & Units 05/18/2015 05/16/2015 09/11/2014  Glucose 65 - 99 mg/dL 101(H) 95 94  BUN 6 - 20 mg/dL 9 8 8   Creatinine 0.44 - 1.00 mg/dL 0.63 0.68 0.80  Sodium 135 - 145 mmol/L 134(L) 136 142  Potassium 3.5 - 5.1 mmol/L 3.9 4.5 4.5  Chloride 101 - 111 mmol/L 108 109 102  CO2 22 - 32 mmol/L 23 22 20   Calcium 8.9 - 10.3 mg/dL 8.0(L) 9.0 9.1  Total Protein 6.5 - 8.1 g/dL 5.1(L) 5.8(L) 6.5  Total Bilirubin 0.3 - 1.2 mg/dL 0.5 0.4 <0.2  Alkaline Phos 38 - 126 U/L 138(H) 173(H) 58  AST 15 - 41 U/L 26 25 16   ALT 14 - 54 U/L 10(L) 9(L) 9   . Lipid Panel     Component Value Date/Time   CHOL 159 09/11/2014 1555   TRIG 239 (H) 09/11/2014 1555   HDL 39 (L) 09/11/2014 1555   CHOLHDL 4.1 09/11/2014 1555   LDLCALC 72 09/11/2014 1555   LABVLDL 48 (H) 09/11/2014 1555    This  SmartLink has not been configured with any valid records.    Notable Signs/Symptoms: fatigue  Lifestyle & Dietary Hx She has 5 kids and hasn't been able to lose weigh from her last 2 pregnancies. Would like to ultimately lose down to 150 lbs but slow and steady weight loss.  Weighed 200 lbs 3 yrs ago when she had her last baby.  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 Stress / self-care: stress of job, raising children Current average weekly physical activity:ADL  24-Hr Dietary Recall First Meal: skipped breakfast but has a Curator breakfast  Snack:  Second Meal:shrimp fried, french fries, sweet tea 16 oz Snack:  Third Meal: leftover from lunch, Regular green tea Snack:  Beverages: sweet tea, green tea, soda  Estimated Energy Needs Calories: 1400 Carbohydrate: 158g Protein: 100 g Fat: 39g   NUTRITION DIAGNOSIS  NI-1.5 Excessive energy intake As related to Excessive calorie intake from high fat high sodium and processed foods .  As evidenced by BMI > 30 and diet recall and weight gain.Marland Kitchen   NUTRITION INTERVENTION  Nutrition education (E-1) on the following topics:  . Weight loss tips,  meal planning, portion sizes, timing of meals, nutrient dense foods, emotional eating and benefits of exercise in it's role for stress reduction and weight loss.   Handouts Provided Include   My Plate, Weight loss tips, Bake/broil and grilling foods handouts emailed.  Learning Style & Readiness for Change Teaching method utilized: Visual & Auditory  Demonstrated degree of understanding via: Teach Back  Barriers to learning/adherence to lifestyle change: none  Goals Established by Pt . Follow My Plate . Eat three meals per day . Cut out all sweetened and sf beverages and drink ONLY water. . Cut out processed foods and junk food/fast foods. . Increase fresh fruits, lower carb vegetables  and more whole grains. Lose 1 lb per week Keep a food journal daily Drink 100 oz of water per day.    MONITORING & EVALUATION Dietary intake, weekly physical activity, and weight  in 1 month.  Next Steps  Patient is to start keeping a food journal..

## 2020-04-09 ENCOUNTER — Telehealth: Payer: Medicaid Other | Admitting: Nutrition

## 2020-05-02 ENCOUNTER — Encounter: Payer: Self-pay | Admitting: Nutrition

## 2020-05-02 ENCOUNTER — Ambulatory Visit: Payer: Medicaid Other | Admitting: Nutrition

## 2020-05-02 ENCOUNTER — Telehealth: Payer: Self-pay | Admitting: Nutrition

## 2020-05-02 NOTE — Telephone Encounter (Signed)
vm left to call and r/s missed appt

## 2020-05-02 NOTE — Patient Instructions (Signed)
  Goals Established by Pt . Follow My Plate . Eat three meals per day . Cut out all sweetened and sf beverages and drink ONLY water. . Cut out processed foods and junk food/fast foods. . Increase fresh fruits, lower carb vegetables and more whole grains. Lose 1 lb per week Keep a food journal daily Drink 100 oz of water per day

## 2020-05-07 ENCOUNTER — Ambulatory Visit: Payer: Medicaid Other | Admitting: Nurse Practitioner

## 2020-05-07 ENCOUNTER — Other Ambulatory Visit: Payer: Self-pay

## 2020-05-07 ENCOUNTER — Encounter: Payer: Self-pay | Admitting: Nurse Practitioner

## 2020-05-07 ENCOUNTER — Ambulatory Visit: Payer: Medicaid Other | Admitting: Family Medicine

## 2020-05-07 MED ORDER — PHENTERMINE HCL 15 MG PO CAPS: 15.0000 mg | ORAL_CAPSULE | ORAL | 0 refills | Status: DC

## 2020-05-07 NOTE — Progress Notes (Signed)
Acute Office Visit  Subjective:    Patient ID: Tracy Rojas, female    DOB: May 30, 1975, 45 y.o.   MRN: [id number]  Chief Complaint  Patient presents with  . Weight Check    Pt has been seeing the dietician, has not been seeing the results like she thought. Would like to talk about phentermine.     HPI Patient is in today for weight check. She has been seeing Jearld Fenton, RD and she has cut out sugars and starches. She hasn't noticed any weight loss. She denies binge eating, and states that she has bene snacking less throughout the day.  Past Medical History:  Diagnosis Date  . Acute cystitis with hematuria 05/24/2019  . Advanced maternal age in multigravida 05/17/2015  . AMA (advanced maternal age) multigravida 8+   . Bronchitis 04/2012  . Finger mass, left 04/11/2019  . 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 GERD (gastroesophageal reflux disease)   . Headache(784.0)   . History of pre-eclampsia in prior pregnancy, currently pregnant   . Impaired glucose tolerance in pregnancy 05/17/2015  . Migraine without aura 12/13/2012  . Mild preeclampsia 05/16/2015  . Normal pregnancy in multigravida in third trimester 10/01/2016  . Normal pregnancy in multigravida in third trimester 10/01/2016  . Normal vaginal delivery 05/17/2015  . SVD (spontaneous vaginal delivery) 10/05/2016  . Tension headache 12/13/2012  . Thrombocytopenia ([place]) 02/17/2012    Past Surgical History:  Procedure Laterality Date  . TIBIA FRACTURE SURGERY Left 2004   rod and two screws due to trauma    Family History  Problem Relation Age of Onset  . Hypertension Mother   . Migraines Mother        vascular migraines  . Hypertension Father   . Cancer Father        Throat    Social History   Socioeconomic History  . Marital status: Single    Spouse name: Not on file  . Number of children: 3  . Years of education: G.E. D  . Highest education level: GED or equivalent  Occupational History  . Occupation: CNA     Comment: Engineer, water  Tobacco Use  . Smoking status: Never Smoker  . Smokeless tobacco: Never Used  Vaping Use  . Vaping Use: Never used  Substance and Sexual Activity  . Alcohol use: No  . Drug use: No  . Sexual activity: Yes    Birth control/protection: Condom, Other-see comments    Comment: pull out method  Other Topics Concern  . Not on file  Social History Narrative   Patient lives at home with children.    Caffeine Use: 5-6 sodas daily   Grew up in [place].    Lived in [place] since 2014.       Has five children, vary in age from   [place] home   Emma-3-At home   Braelynn-2-At home         Family in [place].    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   Makes sure to include vegetables   Does not drink a lot of water      Wears seatbelt   Does occasionally wear sunscreen   Smoke detectors checks batteries frequently   Social Determinants of Health   Financial Resource Strain: Not on file  Food Insecurity: Not on file  Transportation Needs: Not on file  Physical Activity: Not  on file  Stress: Not on file  Social Connections: Not on file  Intimate Partner Violence: Not on file    Outpatient Medications Prior to Visit  Medication Sig Dispense Refill  . acetaminophen (TYLENOL) 500 MG tablet Take 1,000 mg by mouth every 6 (six) hours as needed for mild pain, moderate pain, fever or headache.     No facility-administered medications prior to visit.    Allergies  Allergen Reactions  . Dimetapp C [Phenylephrine-Bromphen-Codeine] Swelling and Other (See Comments)    Reaction:  Facial and hand swelling   . Ibuprofen Other (See Comments)    Reaction:  Platelet count drops     Review of Systems  Constitutional: Negative.   Respiratory: Negative.   Cardiovascular: Negative.   Musculoskeletal: Negative.   Psychiatric/Behavioral:  Negative.        Objective:    Physical Exam Constitutional:      Appearance: She is obese.  Cardiovascular:     Rate and Rhythm: Normal rate and regular rhythm.     Pulses: Normal pulses.     Heart sounds: Normal heart sounds.  Pulmonary:     Effort: Pulmonary effort is normal.     Breath sounds: Normal breath sounds.  Musculoskeletal:        General: Normal range of motion.  Neurological:     Mental Status: She is alert.  Psychiatric:        Mood and Affect: Mood normal.        Behavior: Behavior normal.        Thought Content: Thought content normal.        Judgment: Judgment normal.     BP 124/82   Pulse 94   Temp 98 F (36.7 C)   Resp 20   Ht '5\' 3"'  (1.6 m)   Wt 230 lb (104.3 kg)   SpO2 98%   BMI 40.74 kg/m  Wt Readings from Last 3 Encounters:  05/07/20 230 lb (104.3 kg)  02/06/20 231 lb (104.8 kg)  10/03/19 232 lb 1.9 oz (105.3 kg)    Health Maintenance Due  Topic Date Due  . Hepatitis C Screening  Never done  . COVID-19 Vaccine (1) Never done  . PAP SMEAR-Modifier  09/10/2017    There are no preventive care reminders to display for this patient.   Lab Results  Component Value Date   TSH 1.330 09/11/2014   Lab Results  Component Value Date   WBC 10.9 (H) 10/06/2016   HGB 10.7 (L) 10/06/2016   HCT 32.4 (L) 10/06/2016   MCV 86.6 10/06/2016   PLT 104 (L) 10/06/2016   Lab Results  Component Value Date   NA 134 (L) 05/18/2015   K 3.9 05/18/2015   CO2 23 05/18/2015   GLUCOSE 101 (H) 05/18/2015   BUN 9 05/18/2015   CREATININE 0.63 05/18/2015   BILITOT 0.5 05/18/2015   ALKPHOS 138 (H) 05/18/2015   AST 26 05/18/2015   ALT 10 (L) 05/18/2015   PROT 5.1 (L) 05/18/2015   ALBUMIN 2.4 (L) 05/18/2015   CALCIUM 8.0 (L) 05/18/2015   ANIONGAP 3 (L) 05/18/2015   Lab Results  Component Value Date   CHOL 159 09/11/2014   Lab Results  Component Value Date   HDL 39 (L) 09/11/2014   Lab Results  Component Value Date   LDLCALC 72 09/11/2014    Lab Results  Component Value Date   TRIG 239 (H) 09/11/2014   Lab Results  Component Value Date   CHOLHDL 4.1  09/11/2014   No results found for: HGBA1C     Assessment & Plan:   Problem List Items Addressed This Visit      Other   Morbid obesity ([place])    BMI Readings from Last 2 Encounters:  05/07/20 40.74 kg/m  02/06/20 40.92 kg/m   Wt Readings from Last 3 Encounters:  05/07/20 230 lb (104.3 kg)  02/06/20 231 lb (104.8 kg)  10/03/19 232 lb 1.9 oz (105.3 kg)   -Rx. Phentermine -med check in 1 month       Relevant Medications   phentermine 15 MG capsule   Other Relevant Orders   CBC with Differential/Platelet   CMP14+EGFR   Lipid Panel With LDL/HDL Ratio       Meds ordered this encounter  Medications  . phentermine 15 MG capsule    Sig: Take 1 capsule (15 mg total) by mouth every morning.    Dispense:  30 capsule    Refill:  0     Noreene Larsson, NP

## 2020-05-07 NOTE — Patient Instructions (Signed)
We will meet back up in 1 month for a med check.  I don't see any recent labs, so lets get fasting labs drawn 2-3 days prior to your next visit.

## 2020-05-07 NOTE — Assessment & Plan Note (Signed)
BMI Readings from Last 2 Encounters:  05/07/20 40.74 kg/m  02/06/20 40.92 kg/m   Wt Readings from Last 3 Encounters:  05/07/20 230 lb (104.3 kg)  02/06/20 231 lb (104.8 kg)  10/03/19 232 lb 1.9 oz (105.3 kg)   -Rx. Phentermine -med check in 1 month

## 2020-06-06 ENCOUNTER — Other Ambulatory Visit: Payer: Self-pay

## 2020-06-06 ENCOUNTER — Encounter: Payer: Self-pay | Admitting: Nurse Practitioner

## 2020-06-06 ENCOUNTER — Ambulatory Visit: Payer: Medicaid Other | Admitting: Nurse Practitioner

## 2020-06-06 VITALS — BP 130/83 | HR 88 | Temp 99.0°F | Resp 20 | Ht 63.0 in | Wt 224.0 lb

## 2020-06-06 DIAGNOSIS — Z139 Encounter for screening, unspecified: Secondary | ICD-10-CM | POA: Diagnosis not present

## 2020-06-06 DIAGNOSIS — Z0001 Encounter for general adult medical examination with abnormal findings: Secondary | ICD-10-CM | POA: Diagnosis not present

## 2020-06-06 NOTE — Progress Notes (Signed)
Acute Office Visit  Subjective:    Patient ID: Tracy Rojas, female    DOB: 07-Jan-1976, 45 y.o.   MRN: [id number]  Chief Complaint  Patient presents with  . Medication Refill    Med check     HPI Patient is in today for med check for phentermine.  She was taking the 15 mg dose x1 month.  Denies adverse medication effects.  Past Medical History:  Diagnosis Date  . Acute cystitis with hematuria 05/24/2019  . Advanced maternal age in multigravida 05/17/2015  . AMA (advanced maternal age) multigravida 51+   . Bronchitis 04/2012  . Finger mass, left 04/11/2019  . 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 GERD (gastroesophageal reflux disease)   . Headache(784.0)   . History of pre-eclampsia in prior pregnancy, currently pregnant   . Impaired glucose tolerance in pregnancy 05/17/2015  . Migraine without aura 12/13/2012  . Mild preeclampsia 05/16/2015  . Normal pregnancy in multigravida in third trimester 10/01/2016  . Normal pregnancy in multigravida in third trimester 10/01/2016  . Normal vaginal delivery 05/17/2015  . SVD (spontaneous vaginal delivery) 10/05/2016  . Tension headache 12/13/2012  . Thrombocytopenia ([place]) 02/17/2012    Past Surgical History:  Procedure Laterality Date  . TIBIA FRACTURE SURGERY Left 2004   rod and two screws due to trauma    Family History  Problem Relation Age of Onset  . Hypertension Mother   . Migraines Mother        vascular migraines  . Hypertension Father   . Cancer Father        Throat    Social History   Socioeconomic History  . Marital status: Single    Spouse name: Not on file  . Number of children: 3  . Years of education: G.E. D  . Highest education level: GED or equivalent  Occupational History  . Occupation: CNA    Comment: Engineer, water  Tobacco Use  . Smoking status: Never Smoker  . Smokeless tobacco: Never Used  Vaping Use  . Vaping Use: Never used  Substance and Sexual Activity  . Alcohol use: No  . Drug use: No   . Sexual activity: Yes    Birth control/protection: Condom, Other-see comments    Comment: pull out method  Other Topics Concern  . Not on file  Social History Narrative   Patient lives at home with children.    Caffeine Use: 5-6 sodas daily   Grew up in [place].    Lived in [place] since 2014.       Has five children, vary in age from   [place] home   Emma-3-At home   Braelynn-2-At home         Family in [place].    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   Makes sure to include vegetables   Does not drink a lot of water      Wears seatbelt   Does occasionally wear sunscreen   Smoke detectors checks batteries frequently   Social Determinants of Health   Financial Resource Strain: Not on file  Food Insecurity: Not on file  Transportation Needs: Not on file  Physical Activity: Not on file  Stress: Not on file  Social Connections: Not on file  Intimate Partner Violence: Not on file    Outpatient Medications Prior to Visit  Medication Sig Dispense Refill  . acetaminophen (TYLENOL) 500  MG tablet Take 1,000 mg by mouth every 6 (six) hours as needed for mild pain, moderate pain, fever or headache.    . phentermine 15 MG capsule Take 1 capsule (15 mg total) by mouth every morning. 30 capsule 0   No facility-administered medications prior to visit.    Allergies  Allergen Reactions  . Dimetapp C [Phenylephrine-Bromphen-Codeine] Swelling and Other (See Comments)    Reaction:  Facial and hand swelling   . Ibuprofen Other (See Comments)    Reaction:  Platelet count drops     Review of Systems  Constitutional: Negative.   Respiratory: Negative.   Cardiovascular: Negative.   Musculoskeletal: Negative.   Psychiatric/Behavioral: Negative.        Objective:    Physical Exam Constitutional:      Appearance: Normal appearance.  Cardiovascular:     Rate  and Rhythm: Normal rate and regular rhythm.     Pulses: Normal pulses.     Heart sounds: Normal heart sounds.  Pulmonary:     Effort: Pulmonary effort is normal.     Breath sounds: Normal breath sounds.  Musculoskeletal:        General: Normal range of motion.  Neurological:     Mental Status: She is alert.  Psychiatric:        Mood and Affect: Mood normal.        Behavior: Behavior normal.        Thought Content: Thought content normal.        Judgment: Judgment normal.     BP 130/83   Pulse 88   Temp 99 F (37.2 C)   Resp 20   Ht 5' 3" (1.6 m)   Wt 224 lb (101.6 kg)   SpO2 96%   BMI 39.68 kg/m  Wt Readings from Last 3 Encounters:  06/06/20 224 lb (101.6 kg)  05/07/20 230 lb (104.3 kg)  02/06/20 231 lb (104.8 kg)    Health Maintenance Due  Topic Date Due  . Hepatitis C Screening  Never done  . COVID-19 Vaccine (1) Never done  . PAP SMEAR-Modifier  09/10/2017    There are no preventive care reminders to display for this patient.   Lab Results  Component Value Date   TSH 1.330 09/11/2014   Lab Results  Component Value Date   WBC 10.9 (H) 10/06/2016   HGB 10.7 (L) 10/06/2016   HCT 32.4 (L) 10/06/2016   MCV 86.6 10/06/2016   PLT 104 (L) 10/06/2016   Lab Results  Component Value Date   NA 134 (L) 05/18/2015   K 3.9 05/18/2015   CO2 23 05/18/2015   GLUCOSE 101 (H) 05/18/2015   BUN 9 05/18/2015   CREATININE 0.63 05/18/2015   BILITOT 0.5 05/18/2015   ALKPHOS 138 (H) 05/18/2015   AST 26 05/18/2015   ALT 10 (L) 05/18/2015   PROT 5.1 (L) 05/18/2015   ALBUMIN 2.4 (L) 05/18/2015   CALCIUM 8.0 (L) 05/18/2015   ANIONGAP 3 (L) 05/18/2015   Lab Results  Component Value Date   CHOL 159 09/11/2014   Lab Results  Component Value Date   HDL 39 (L) 09/11/2014   Lab Results  Component Value Date   LDLCALC 72 09/11/2014   Lab Results  Component Value Date   TRIG 239 (H) 09/11/2014   Lab Results  Component Value Date   CHOLHDL 4.1 09/11/2014   No  results found for: HGBA1C     Assessment & Plan:   Problem List Items Addressed This  Visit      Other   Morbid obesity ([place])    BMI Readings from Last 3 Encounters:  06/06/20 39.68 kg/m  05/07/20 40.74 kg/m  02/06/20 40.92 kg/m  -she lost some weight with phentermine       Relevant Orders   TSH   Annual visit for general adult medical examination with abnormal findings   Relevant Orders   CBC with Differential/Platelet   CMP14+EGFR   Lipid Panel With LDL/HDL Ratio   TSH    Other Visit Diagnoses    Screening due    -  Primary   Relevant Orders   HCV Ab w/Rflx to Verification       No orders of the defined types were placed in this encounter.    Noreene Larsson, NP

## 2020-06-06 NOTE — Patient Instructions (Signed)
Please have fasting labs drawn prior to your appointment.

## 2020-06-06 NOTE — Assessment & Plan Note (Signed)
BMI Readings from Last 3 Encounters:  06/06/20 39.68 kg/m  05/07/20 40.74 kg/m  02/06/20 40.92 kg/m  -she lost some weight with phentermine

## 2020-06-07 ENCOUNTER — Telehealth: Payer: Self-pay

## 2020-06-07 ENCOUNTER — Other Ambulatory Visit: Payer: Self-pay

## 2020-06-07 MED ORDER — PHENTERMINE HCL 15 MG PO CAPS: 15.0000 mg | ORAL_CAPSULE | ORAL | 0 refills | Status: DC

## 2020-06-07 NOTE — Telephone Encounter (Signed)
Patient called left voice mail need med refill was not sent in yesterday to the pharmacy.  phentermine 15 MG capsule   Pharmacy: [facility]

## 2020-06-07 NOTE — Telephone Encounter (Signed)
Refill re-sent  

## 2020-07-04 DIAGNOSIS — Z0001 Encounter for general adult medical examination with abnormal findings: Secondary | ICD-10-CM | POA: Diagnosis not present

## 2020-07-04 DIAGNOSIS — Z139 Encounter for screening, unspecified: Secondary | ICD-10-CM | POA: Diagnosis not present

## 2020-07-05 LAB — LIPID PANEL WITH LDL/HDL RATIO
Cholesterol, Total: 146 mg/dL (ref 100–199)
HDL: 47 mg/dL (ref >39)
LDL Chol Calc (NIH): 77 mg/dL (ref 0–99)
LDL/HDL Ratio: 1.6 ratio (ref 0.0–3.2)
Triglycerides: 125 mg/dL (ref 0–149)
VLDL Cholesterol Cal: 22 mg/dL (ref 5–40)

## 2020-07-05 LAB — CMP14+EGFR
ALT: 21 IU/L (ref 0–32)
AST: 16 IU/L (ref 0–40)
Albumin/Globulin Ratio: 1.9 (ref 1.2–2.2)
Albumin: 4.5 g/dL (ref 3.8–4.8)
Alkaline Phosphatase: 87 IU/L (ref 44–121)
BUN/Creatinine Ratio: 10 (ref 9–23)
BUN: 9 mg/dL (ref 6–24)
Bilirubin Total: 0.2 mg/dL (ref 0.0–1.2)
CO2: 20 mmol/L (ref 20–29)
Calcium: 9.2 mg/dL (ref 8.7–10.2)
Chloride: 99 mmol/L (ref 96–106)
Creatinine, Ser: 0.92 mg/dL (ref 0.57–1.00)
Globulin, Total: 2.4 g/dL (ref 1.5–4.5)
Glucose: 96 mg/dL (ref 65–99)
Potassium: 4.8 mmol/L (ref 3.5–5.2)
Sodium: 136 mmol/L (ref 134–144)
Total Protein: 6.9 g/dL (ref 6.0–8.5)
eGFR: 80 mL/min/{1.73_m2} (ref >59)

## 2020-07-05 LAB — CBC WITH DIFFERENTIAL/PLATELET
Basophils Absolute: 0 10*3/uL (ref 0.0–0.2)
Basos: 0 %
EOS (ABSOLUTE): 0.1 10*3/uL (ref 0.0–0.4)
Eos: 1 %
Hematocrit: 42.4 % (ref 34.0–46.6)
Hemoglobin: 14 g/dL (ref 11.1–15.9)
Immature Grans (Abs): 0 10*3/uL (ref 0.0–0.1)
Immature Granulocytes: 0 %
Lymphocytes Absolute: 2.7 10*3/uL (ref 0.7–3.1)
Lymphs: 35 %
MCH: 29.1 pg (ref 26.6–33.0)
MCHC: 33 g/dL (ref 31.5–35.7)
MCV: 88 fL (ref 79–97)
Monocytes Absolute: 0.6 10*3/uL (ref 0.1–0.9)
Monocytes: 8 %
Neutrophils Absolute: 4.3 10*3/uL (ref 1.4–7.0)
Neutrophils: 56 %
Platelets: 119 10*3/uL — ABNORMAL LOW (ref 150–450)
RBC: 4.81 x10E6/uL (ref 3.77–5.28)
RDW: 13 % (ref 11.7–15.4)
WBC: 7.7 10*3/uL (ref 3.4–10.8)

## 2020-07-05 LAB — TSH: TSH: 1.66 u[IU]/mL (ref 0.450–4.500)

## 2020-07-05 LAB — HEPATITIS C ANTIBODY: Hep C Virus Ab: <0.1 s/co ratio (ref 0.0–0.9)

## 2020-07-05 NOTE — Progress Notes (Signed)
Her labs look great. Her platelet count is a little low, but this seems to be her baseline.

## 2020-07-09 ENCOUNTER — Other Ambulatory Visit: Payer: Self-pay

## 2020-07-09 ENCOUNTER — Ambulatory Visit (INDEPENDENT_AMBULATORY_CARE_PROVIDER_SITE_OTHER): Payer: Medicaid Other

## 2020-07-09 DIAGNOSIS — Z111 Encounter for screening for respiratory tuberculosis: Secondary | ICD-10-CM | POA: Diagnosis not present

## 2020-07-11 ENCOUNTER — Encounter: Payer: Self-pay | Admitting: Nurse Practitioner

## 2020-07-11 ENCOUNTER — Other Ambulatory Visit: Payer: Self-pay

## 2020-07-11 ENCOUNTER — Ambulatory Visit (INDEPENDENT_AMBULATORY_CARE_PROVIDER_SITE_OTHER): Payer: Medicaid Other | Admitting: Nurse Practitioner

## 2020-07-11 DIAGNOSIS — I10 Essential (primary) hypertension: Secondary | ICD-10-CM | POA: Diagnosis not present

## 2020-07-11 DIAGNOSIS — Z0001 Encounter for general adult medical examination with abnormal findings: Secondary | ICD-10-CM

## 2020-07-11 DIAGNOSIS — D473 Essential (hemorrhagic) thrombocythemia: Secondary | ICD-10-CM

## 2020-07-11 DIAGNOSIS — E781 Pure hyperglyceridemia: Secondary | ICD-10-CM

## 2020-07-11 DIAGNOSIS — E782 Mixed hyperlipidemia: Secondary | ICD-10-CM | POA: Diagnosis not present

## 2020-07-11 MED ORDER — PHENTERMINE HCL 37.5 MG PO CAPS: 37.5000 mg | ORAL_CAPSULE | ORAL | 2 refills | Status: DC

## 2020-07-11 NOTE — Assessment & Plan Note (Signed)
-  platelets 119 today; she has long hx of thrombocytopenia

## 2020-07-11 NOTE — Assessment & Plan Note (Signed)
Wt Readings from Last 3 Encounters:  07/11/20 218 lb (98.9 kg)  06/06/20 224 lb (101.6 kg)  05/07/20 230 lb (104.3 kg)   -INCREASE phentermine today

## 2020-07-11 NOTE — Progress Notes (Signed)
Established Patient Office Visit  Subjective:  Patient ID: Tracy Rojas, female    DOB: 1975/03/10  Age: 45 y.o. MRN: [id number]  CC:  Chief Complaint  Patient presents with  . Annual Exam    HPI [name] presents for physical exam. No acute concerns.  Past Medical History:  Diagnosis Date  . Acute cystitis with hematuria 05/24/2019  . Advanced maternal age in multigravida 05/17/2015  . AMA (advanced maternal age) multigravida 81+   . Bronchitis 04/2012  . Finger mass, left 04/11/2019  . 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 GERD (gastroesophageal reflux disease)   . Headache(784.0)   . History of pre-eclampsia in prior pregnancy, currently pregnant   . Impaired glucose tolerance in pregnancy 05/17/2015  . Migraine without aura 12/13/2012  . Mild preeclampsia 05/16/2015  . Normal pregnancy in multigravida in third trimester 10/01/2016  . Normal pregnancy in multigravida in third trimester 10/01/2016  . Normal vaginal delivery 05/17/2015  . SVD (spontaneous vaginal delivery) 10/05/2016  . Tension headache 12/13/2012  . Thrombocytopenia ([place]) 02/17/2012    Past Surgical History:  Procedure Laterality Date  . TIBIA FRACTURE SURGERY Left 2004   rod and two screws due to trauma    Family History  Problem Relation Age of Onset  . Hypertension Mother   . Migraines Mother        vascular migraines  . Hypertension Father   . Cancer Father        Throat    Social History   Socioeconomic History  . Marital status: Single    Spouse name: Not on file  . Number of children: 3  . Years of education: G.E. D  . Highest education level: GED or equivalent  Occupational History  . Occupation: CNA    Comment: Engineer, water  Tobacco Use  . Smoking status: Never Smoker  . Smokeless tobacco: Never Used  Vaping Use  . Vaping Use: Never used  Substance and Sexual Activity  . Alcohol use: No  . Drug use: No  . Sexual activity: Yes    Birth control/protection: Condom,  Other-see comments    Comment: pull out method  Other Topics Concern  . Not on file  Social History Narrative   Patient lives at home with children.    Caffeine Use: 5-6 sodas daily   Grew up in [place].    Lived in [place] since 2014.       Has five children, vary in age from   [place] home   Emma-3-At home   Braelynn-2-At home         Family in [place].    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   Makes sure to include vegetables   Does not drink a lot of water      Wears seatbelt   Does occasionally wear sunscreen   Smoke detectors checks batteries frequently   Social Determinants of Health   Financial Resource Strain: Not on file  Food Insecurity: Not on file  Transportation Needs: Not on file  Physical Activity: Not on file  Stress: Not on file  Social Connections: Not on file  Intimate Partner Violence: Not on file    Outpatient Medications Prior to Visit  Medication Sig Dispense Refill  . acetaminophen (TYLENOL) 500 MG tablet Take 1,000 mg by mouth every 6 (six) hours as needed for mild pain, moderate pain, fever  or headache.    . phentermine 15 MG capsule Take 1 capsule (15 mg total) by mouth every morning. 30 capsule 0   No facility-administered medications prior to visit.    Allergies  Allergen Reactions  . Dimetapp C [Phenylephrine-Bromphen-Codeine] Swelling and Other (See Comments)    Reaction:  Facial and hand swelling   . Ibuprofen Other (See Comments)    Reaction:  Platelet count drops   . Other Hives and Rash    ROS Review of Systems  Constitutional: Negative.   HENT: Negative.   Eyes: Negative.   Respiratory: Negative.   Cardiovascular: Negative.   Gastrointestinal: Negative.   Endocrine: Negative.   Genitourinary: Negative.   Musculoskeletal: Negative.   Skin: Negative.   Allergic/Immunologic: Negative.   Neurological:  Negative.   Hematological: Negative.   Psychiatric/Behavioral: Negative.       Objective:    Physical Exam Constitutional:      Appearance: Normal appearance.  HENT:     Head: Normocephalic and atraumatic.     Right Ear: Tympanic membrane, ear canal and external ear normal.     Left Ear: Tympanic membrane, ear canal and external ear normal.     Nose: Nose normal.     Mouth/Throat:     Mouth: Mucous membranes are moist.     Pharynx: Oropharynx is clear.  Eyes:     Extraocular Movements: Extraocular movements intact.     Conjunctiva/sclera: Conjunctivae normal.     Pupils: Pupils are equal, round, and reactive to light.  Cardiovascular:     Rate and Rhythm: Normal rate and regular rhythm.     Pulses: Normal pulses.     Heart sounds: Normal heart sounds.  Pulmonary:     Effort: Pulmonary effort is normal.     Breath sounds: Normal breath sounds.  Abdominal:     General: Abdomen is flat. Bowel sounds are normal.     Palpations: Abdomen is soft.  Musculoskeletal:        General: Normal range of motion.     Cervical back: Normal range of motion and neck supple.  Skin:    General: Skin is warm and dry.     Capillary Refill: Capillary refill takes less than 2 seconds.  Neurological:     General: No focal deficit present.     Mental Status: She is alert and oriented to person, place, and time.  Psychiatric:        Mood and Affect: Mood normal.        Behavior: Behavior normal.        Thought Content: Thought content normal.        Judgment: Judgment normal.     BP 124/85   Pulse 92   Temp 98.3 F (36.8 C)   Resp 20   Ht _0  (1.6 m)   Wt 218 lb (98.9 kg)   SpO2 97%   BMI 38.62 kg/m  Wt Readings from Last 3 Encounters:  07/11/20 218 lb (98.9 kg)  06/06/20 224 lb (101.6 kg)  05/07/20 230 lb (104.3 kg)     Health Maintenance Due  Topic Date Due  . PAP SMEAR-Modifier  03/03/2019  . COVID-19 Vaccine (3 - Moderna risk 4-dose series) 05/24/2020    There are  no preventive care reminders to display for this patient.  Lab Results  Component Value Date   TSH 1.660 07/04/2020   Lab Results  Component Value Date   WBC 7.7 07/04/2020   HGB 14.0 07/04/2020  HCT 42.4 07/04/2020   MCV 88 07/04/2020   PLT 119 (L) 07/04/2020   Lab Results  Component Value Date   NA 136 07/04/2020   K 4.8 07/04/2020   CO2 20 07/04/2020   GLUCOSE 96 07/04/2020   BUN 9 07/04/2020   CREATININE 0.92 07/04/2020   BILITOT 0.2 07/04/2020   ALKPHOS 87 07/04/2020   AST 16 07/04/2020   ALT 21 07/04/2020   PROT 6.9 07/04/2020   ALBUMIN 4.5 07/04/2020   CALCIUM 9.2 07/04/2020   ANIONGAP 3 (L) 05/18/2015   EGFR 80 07/04/2020   Lab Results  Component Value Date   CHOL 146 07/04/2020   Lab Results  Component Value Date   HDL 47 07/04/2020   Lab Results  Component Value Date   LDLCALC 77 07/04/2020   Lab Results  Component Value Date   TRIG 125 07/04/2020   Lab Results  Component Value Date   CHOLHDL 4.1 09/11/2014   No results found for: HGBA1C    Assessment & Plan:   Problem List Items Addressed This Visit      Cardiovascular and Mediastinum   Hypertension    BP Readings from Last 3 Encounters:  07/11/20 124/85  06/06/20 130/83  05/07/20 124/82   -well controlled        Hematopoietic and Hemostatic   Essential thrombocythemia ([place])    -platelets 119 today; she has long hx of thrombocytopenia        Other   Morbid obesity ([place])    Wt Readings from Last 3 Encounters:  07/11/20 218 lb (98.9 kg)  06/06/20 224 lb (101.6 kg)  05/07/20 230 lb (104.3 kg)   -INCREASE phentermine today      Relevant Medications   phentermine 37.5 MG capsule   Isolated hypertriglyceridemia    Lab Results  Component Value Date   CHOL 146 07/04/2020   HDL 47 07/04/2020   LDLCALC 77 07/04/2020   TRIG 125 07/04/2020   CHOLHDL 4.1 09/11/2014   -doing well today      Annual visit for general adult medical examination with abnormal findings     -physical exam performed today -up to date with preventative care except for PAP; she will see GYN for this         Meds ordered this encounter  Medications  . phentermine 37.5 MG capsule    Sig: Take 1 capsule (37.5 mg total) by mouth every morning.    Dispense:  30 capsule    Refill:  2    Increased dose today    Follow-up: Return in about 1 month (around 08/11/2020) for Med check (phentermine).    Noreene Larsson, NP

## 2020-07-11 NOTE — Assessment & Plan Note (Signed)
Lab Results  Component Value Date   CHOL 146 07/04/2020   HDL 47 07/04/2020   LDLCALC 77 07/04/2020   TRIG 125 07/04/2020   CHOLHDL 4.1 09/11/2014   -doing well today

## 2020-07-11 NOTE — Assessment & Plan Note (Signed)
BP Readings from Last 3 Encounters:  07/11/20 124/85  06/06/20 130/83  05/07/20 124/82   -well controlled

## 2020-07-11 NOTE — Assessment & Plan Note (Signed)
-  physical exam performed today -up to date with preventative care except for PAP; she will see GYN for this

## 2020-07-23 ENCOUNTER — Ambulatory Visit (INDEPENDENT_AMBULATORY_CARE_PROVIDER_SITE_OTHER): Payer: Medicaid Other

## 2020-07-23 ENCOUNTER — Other Ambulatory Visit: Payer: Self-pay

## 2020-07-23 DIAGNOSIS — Z111 Encounter for screening for respiratory tuberculosis: Secondary | ICD-10-CM | POA: Diagnosis not present

## 2020-07-25 LAB — TB SKIN TEST
Induration: 0 mm
TB Skin Test: NEGATIVE

## 2020-08-15 ENCOUNTER — Ambulatory Visit: Payer: Medicaid Other | Admitting: Nurse Practitioner

## 2020-08-27 ENCOUNTER — Other Ambulatory Visit: Payer: Self-pay

## 2020-08-27 ENCOUNTER — Ambulatory Visit: Payer: Medicaid Other | Admitting: Nurse Practitioner

## 2020-08-27 ENCOUNTER — Encounter: Payer: Self-pay | Admitting: Nurse Practitioner

## 2020-08-27 NOTE — Progress Notes (Signed)
Established Patient Office Visit  Subjective:  Patient ID: Tracy Rojas, female    DOB: [birth year]  Age: 45 y.o. MRN: [id number]  CC:  Chief Complaint  Patient presents with   Weight Check    Follow up    HPI Tracy Rojas presents for med check. At last OV, we increased phentermine to 37.5 mg. She states that she cut out sweets and sodas.  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   GERD (gastroesophageal reflux disease)    Headache(784.0)    History of pre-eclampsia in prior pregnancy, currently pregnant    Impaired glucose tolerance in pregnancy 05/17/2015   Migraine without aura 12/13/2012   Mild preeclampsia 05/16/2015   Normal pregnancy in multigravida in third trimester 10/01/2016   Normal pregnancy in multigravida in third trimester 10/01/2016   Normal vaginal delivery 05/17/2015   SVD (spontaneous vaginal delivery) 10/05/2016   Tension headache 12/13/2012   Thrombocytopenia ([place]) 02/17/2012    Past Surgical History:  Procedure Laterality Date   TIBIA FRACTURE SURGERY Left 2004   rod and two screws due to trauma    Family History  Problem Relation Age of Onset   Hypertension Mother    Migraines Mother        vascular migraines   Hypertension Father    Cancer Father        Throat    Social History   Socioeconomic History   Marital status: Single    Spouse name: Not on file   Number of children: 3   Years of education: G.E. D   Highest education level: GED or equivalent  Occupational History   Occupation: CNA    Comment: Engineer, water  Tobacco Use   Smoking status: Never   Smokeless tobacco: Never  Vaping Use   Vaping Use: Never used  Substance and Sexual Activity   Alcohol use: No   Drug use: No   Sexual activity:  Yes    Birth control/protection: Condom, Other-see comments    Comment: pull out method  Other Topics Concern   Not on file  Social History Narrative   Patient lives at home with children.    Caffeine Use: 5-6 sodas daily   Grew up in [place].    Lived in [place] since 2014.       Has five children, vary in age from   [place] home   Emma-3-At home   Braelynn-2-At home         Family in [place].    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   Makes sure to include vegetables   Does not drink a lot of water      Wears seatbelt   Does occasionally wear sunscreen   Smoke detectors checks batteries frequently   Social Determinants of Health   Financial Resource Strain: Not on file  Food Insecurity: Not on file  Transportation Needs: Not on file  Physical Activity: Not on file  Stress: Not on file  Social Connections: Not on file  Intimate Partner Violence: Not on file    Outpatient Medications Prior to Visit  Medication Sig Dispense Refill   acetaminophen (TYLENOL) 500 MG  tablet Take 1,000 mg by mouth every 6 (six) hours as needed for mild pain, moderate pain, fever or headache.     phentermine 37.5 MG capsule Take 1 capsule (37.5 mg total) by mouth every morning. 30 capsule 2   No facility-administered medications prior to visit.    Allergies  Allergen Reactions   Dimetapp C [Phenylephrine-Bromphen-Codeine] Swelling and Other (See Comments)    Reaction:  Facial and hand swelling    Ibuprofen Other (See Comments)    Reaction:  Platelet count drops    Other Hives and Rash    ROS Review of Systems  Constitutional: Negative.   Respiratory: Negative.    Cardiovascular: Negative.   Psychiatric/Behavioral: Negative.       Objective:    Physical Exam Constitutional:      Appearance: Normal appearance.  Cardiovascular:     Rate and Rhythm: Normal rate  and regular rhythm.     Pulses: Normal pulses.     Heart sounds: Normal heart sounds.  Pulmonary:     Effort: Pulmonary effort is normal.     Breath sounds: Normal breath sounds.  Neurological:     Mental Status: She is alert.  Psychiatric:        Mood and Affect: Mood normal.        Behavior: Behavior normal.        Thought Content: Thought content normal.        Judgment: Judgment normal.    BP 135/81 (BP Location: Right Arm, Patient Position: Sitting, Cuff Size: Large)   Pulse 80   Temp (!) 97.5 F (36.4 C) (Temporal)   Ht '5\' 2"'  (1.575 m)   Wt 210 lb (95.3 kg)   LMP 08/16/2020 (Approximate)   SpO2 97%   BMI 38.41 kg/m  Wt Readings from Last 3 Encounters:  08/27/20 210 lb (95.3 kg)  07/11/20 218 lb (98.9 kg)  06/06/20 224 lb (101.6 kg)     Health Maintenance Due  Topic Date Due   PAP SMEAR-Modifier  03/03/2019    There are no preventive care reminders to display for this patient.  Lab Results  Component Value Date   TSH 1.660 07/04/2020   Lab Results  Component Value Date   WBC 7.7 07/04/2020   HGB 14.0 07/04/2020   HCT 42.4 07/04/2020   MCV 88 07/04/2020   PLT 119 (L) 07/04/2020   Lab Results  Component Value Date   NA 136 07/04/2020   K 4.8 07/04/2020   CO2 20 07/04/2020   GLUCOSE 96 07/04/2020   BUN 9 07/04/2020   CREATININE 0.92 07/04/2020   BILITOT 0.2 07/04/2020   ALKPHOS 87 07/04/2020   AST 16 07/04/2020   ALT 21 07/04/2020   PROT 6.9 07/04/2020   ALBUMIN 4.5 07/04/2020   CALCIUM 9.2 07/04/2020   ANIONGAP 3 (L) 05/18/2015   EGFR 80 07/04/2020   Lab Results  Component Value Date   CHOL 146 07/04/2020   Lab Results  Component Value Date   HDL 47 07/04/2020   Lab Results  Component Value Date   LDLCALC 77 07/04/2020   Lab Results  Component Value Date   TRIG 125 07/04/2020   Lab Results  Component Value Date   CHOLHDL 4.1 09/11/2014   No results found for: HGBA1C    Assessment & Plan:   Problem List Items Addressed  This Visit       Other   Morbid obesity ([place])    Wt Readings from Last 3 Encounters:  08/27/20 210 lb (95.3 kg)  07/11/20 218 lb (98.9 kg)  06/06/20 224 lb (101.6 kg)  -she lost 8 pounds since her last visit -continue phentermine -continue to cut our sweets        No orders of the defined types were placed in this encounter.   Follow-up: Return in about 3 months (around 11/27/2020) for Med check (phentermine).    Noreene Larsson, NP

## 2020-08-27 NOTE — Assessment & Plan Note (Signed)
Wt Readings from Last 3 Encounters:  08/27/20 210 lb (95.3 kg)  07/11/20 218 lb (98.9 kg)  06/06/20 224 lb (101.6 kg)   -she lost 8 pounds since her last visit -continue phentermine -continue to cut our sweets

## 2020-11-27 ENCOUNTER — Ambulatory Visit: Payer: Medicaid Other | Admitting: Nurse Practitioner

## 2021-01-10 ENCOUNTER — Ambulatory Visit: Payer: Medicaid Other | Admitting: Internal Medicine

## 2021-01-10 ENCOUNTER — Other Ambulatory Visit: Payer: Self-pay

## 2021-01-15 ENCOUNTER — Ambulatory Visit: Payer: Medicaid Other | Admitting: Nurse Practitioner

## 2021-03-05 ENCOUNTER — Encounter: Payer: Self-pay | Admitting: Nurse Practitioner

## 2021-03-13 ENCOUNTER — Ambulatory Visit: Payer: Medicaid Other

## 2021-03-29 ENCOUNTER — Ambulatory Visit (HOSPITAL_COMMUNITY): Payer: Medicaid Other

## 2021-05-14 ENCOUNTER — Ambulatory Visit: Payer: Medicaid Other | Admitting: Adult Health

## 2021-08-06 ENCOUNTER — Emergency Department (HOSPITAL_COMMUNITY): Payer: Medicaid Other

## 2021-08-06 ENCOUNTER — Encounter (HOSPITAL_COMMUNITY): Payer: Self-pay | Admitting: Emergency Medicine

## 2021-08-06 ENCOUNTER — Other Ambulatory Visit: Payer: Self-pay

## 2021-08-06 DIAGNOSIS — M25532 Pain in left wrist: Secondary | ICD-10-CM | POA: Diagnosis not present

## 2021-08-06 DIAGNOSIS — M779 Enthesopathy, unspecified: Secondary | ICD-10-CM | POA: Diagnosis not present

## 2021-08-06 DIAGNOSIS — M79642 Pain in left hand: Secondary | ICD-10-CM | POA: Diagnosis not present

## 2021-08-06 DIAGNOSIS — M778 Other enthesopathies, not elsewhere classified: Secondary | ICD-10-CM | POA: Diagnosis not present

## 2021-08-06 NOTE — ED Triage Notes (Signed)
Pt c/o left hand pain x a few days, pt reports pain was mild but intensified today, pain radiates from left palm to left wrist with movement of fingers, denies injury

## 2021-08-07 ENCOUNTER — Emergency Department (HOSPITAL_COMMUNITY)
Admission: EM | Admit: 2021-08-07 | Discharge: 2021-08-07 | Disposition: A | Discharge status: Home / Self Care | Payer: Medicaid Other | Attending: Emergency Medicine | Admitting: Emergency Medicine

## 2021-08-07 DIAGNOSIS — M778 Other enthesopathies, not elsewhere classified: Secondary | ICD-10-CM

## 2021-08-07 MED ORDER — DEXAMETHASONE SODIUM PHOSPHATE 10 MG/ML IJ SOLN
10.0000 mg | Freq: Once | INTRAMUSCULAR | Status: AC
  Administered 2021-08-07: 10 mg via INTRAMUSCULAR
  Filled 2021-08-07: qty 1

## 2021-08-07 NOTE — ED Provider Notes (Signed)
  [facility] EMERGENCY DEPARTMENT Provider Note   CSN: [id number] Arrival date & time: 08/06/21  1934     History  Chief Complaint  Patient presents with   Hand Pain    Tracy Rojas is a 46 y.o. female.  Presents to the emergency department for evaluation of pain in the left wrist and hand area.  Denies injury.  Pain significantly worsens with movement.  Symptoms present for a couple of days.      Home Medications Prior to Admission medications   Medication Sig Start Date End Date Taking? Authorizing Provider  acetaminophen (TYLENOL) 500 MG tablet Take 1,000 mg by mouth every 6 (six) hours as needed for mild pain, moderate pain, fever or headache.    [provider]  phentermine 37.5 MG capsule Take 1 capsule (37.5 mg total) by mouth every morning. 07/11/20   Noreene Larsson, NP      Allergies    Dimetapp c [phenylephrine-bromphen-codeine], Ibuprofen, and Other    Review of Systems   Review of Systems  Physical Exam Updated Vital Signs BP (!) 141/90 (BP Location: Right Arm)   Pulse 76   Temp 98.5 F (36.9 C) (Oral)   Resp 18   Ht '5\' 2"'$  (1.575 m)   Wt 95.3 kg   LMP 07/24/2021 (Approximate)   SpO2 99%   BMI 38.41 kg/m  Physical Exam Musculoskeletal:     Left wrist: Tenderness present. No swelling, deformity or effusion. Decreased range of motion.     Comments: Very positive Finkelstein test Diffuse tenderness and pain with motion against resistance at the wrist    ED Results / Procedures / Treatments   Labs (all labs ordered are listed, but only abnormal results are displayed) Labs Reviewed - No data to display  EKG None  Radiology DG Wrist Complete Left  Result Date: 08/06/2021 CLINICAL DATA:  Left hand and wrist pain. EXAM: LEFT WRIST - COMPLETE 3+ VIEW COMPARISON:  None Available. FINDINGS: There is no evidence of fracture or dislocation. There is no evidence of arthropathy or other focal bone abnormality. Soft tissues are unremarkable.  IMPRESSION: Negative. Electronically Signed   By: Ronney Asters M.D.   On: 08/06/2021 21:17    Procedures Procedures    Medications Ordered in ED Medications  dexamethasone (DECADRON) injection 10 mg (has no administration in time range)    ED Course/ Medical Decision Making/ A&P                           Medical Decision Making Amount and/or Complexity of Data Reviewed Radiology: ordered.  Risk Prescription drug management.   Presents with atraumatic pain of the left wrist and hand, near the thumb.  No erythema, warmth or signs of infection.  X-ray negative.  Patient has significant pain with movement of the wrist, very positive Finkelstein test.  Examination consistent with tendinitis.  Conservative treatment, follow-up with orthopedics if not improving.        Final Clinical Impression(s) / ED Diagnoses Final diagnoses:  Left wrist tendinitis    Rx / DC Orders ED Discharge Orders          Ordered    Ambulatory referral to Orthopedic Surgery        08/07/21 0050              Orpah Greek, MD 08/07/21 [phone]

## 2021-08-08 ENCOUNTER — Telehealth: Payer: Self-pay | Admitting: Orthopedic Surgery

## 2021-08-08 ENCOUNTER — Telehealth: Payer: Self-pay

## 2021-08-08 NOTE — Telephone Encounter (Signed)
Called patient to offer appointment as noted following emergency room; left message on voice mail to return call.

## 2021-08-08 NOTE — Telephone Encounter (Signed)
-----   Message from Carole Civil, MD sent at 08/08/2021  9:06 AM EDT ----- TUES PM

## 2021-08-08 NOTE — Telephone Encounter (Signed)
Transition Care Management Unsuccessful Follow-up Telephone Call  Date of discharge and from where:  08/07/2021 from [facility]  Attempts:  1st Attempt  Reason for unsuccessful TCM follow-up call:  Left voice message

## 2021-08-11 NOTE — Telephone Encounter (Signed)
Transition Care Management Unsuccessful Follow-up Telephone Call  Date of discharge and from where:  08/07/2021 from [facility]  Attempts:  2nd Attempt  Reason for unsuccessful TCM follow-up call:  Left voice message

## 2021-08-12 NOTE — Telephone Encounter (Signed)
Transition Care Management Unsuccessful Follow-up Telephone Call  Date of discharge and from where:  08/07/2021[facility]   Attempts:  3rd Attempt  Reason for unsuccessful TCM follow-up call:  Left voice message

## 2021-08-13 ENCOUNTER — Telehealth: Payer: Self-pay | Admitting: Orthopedic Surgery

## 2021-08-13 NOTE — Telephone Encounter (Signed)
Left messages for patient (1) Friday, 08/08/21, offering appointment per Dr Ruthe Mannan message; also today, 08/13/21, offering appointment again.* *patient returned call; aware of appointment.

## 2021-08-13 NOTE — Telephone Encounter (Signed)
-----   Message from Carole Civil, MD sent at 08/08/2021  9:06 AM EDT ----- TUES PM

## 2021-08-18 ENCOUNTER — Ambulatory Visit (INDEPENDENT_AMBULATORY_CARE_PROVIDER_SITE_OTHER): Payer: Medicaid Other | Admitting: Orthopedic Surgery

## 2021-08-18 ENCOUNTER — Encounter: Payer: Self-pay | Admitting: Orthopedic Surgery

## 2021-08-18 VITALS — BP 142/80 | HR 87 | Ht 63.0 in | Wt 220.0 lb

## 2021-08-18 DIAGNOSIS — M778 Other enthesopathies, not elsewhere classified: Secondary | ICD-10-CM | POA: Diagnosis not present

## 2021-08-18 MED ORDER — PREDNISONE 10 MG PO TABS: 10.0000 mg | ORAL_TABLET | Freq: Three times a day (TID) | ORAL | 1 refills | Status: DC

## 2021-08-18 NOTE — Progress Notes (Signed)
Chief Complaint  Patient presents with   Hand Pain    Left hand and wrist pain     HPI: 46 year old female seen in the emergency room on 8 June with severe pain volar side of the wrist no trauma.  It was thought she might of had de Quervain's syndrome  Patient complains of ulnar-sided wrist pain pain with extreme flexion and pain when the hand has a weight in it.  No numbness or tingling.  The patient is allergic to ibuprofen it causes her to have a platelet drop but she says she can take Aleve without any problems she is also had prednisone in the past no difficulty  Although her pain is somewhat better she still having quite a bit of difficulty.  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   GERD (gastroesophageal reflux disease)    Headache(784.0)    History of pre-eclampsia in prior pregnancy, currently pregnant    Impaired glucose tolerance in pregnancy 05/17/2015   Migraine without aura 12/13/2012   Normal pregnancy in multigravida in third trimester 10/01/2016   Normal pregnancy in multigravida in third trimester 10/01/2016   Normal vaginal delivery 05/17/2015   SVD (spontaneous vaginal delivery) 10/05/2016   Tension headache 12/13/2012   Thrombocytopenia ([place]) 02/17/2012    BP (!) 142/80   Pulse 87   Ht '5\' 3"'$  (1.6 m)   Wt 220 lb (99.8 kg)   LMP 07/24/2021 (Approximate)   BMI 38.97 kg/m    General appearance: Well-developed well-nourished no gross deformities  Cardiovascular normal pulse and perfusion normal color without edema  Neurologically no sensation loss or deficits or pathologic reflexes  Psychological: Awake alert and oriented x3 mood and affect normal  Skin no lacerations or ulcerations no nodularity no palpable masses, no erythema or  nodularity  Musculoskeletal: Gait is normal  Left wrist is tender from the distal wrist crease proximally and the flexor tendons this is exacerbated by wrist extension not much pain with wrist flexion just discomfort.  Flexor carpi radialis nontender flexor carpi ulnaris nontender no neurologic symptoms  Imaging imaging at the hospital in the ER.  My report on those films is that the 4 views were normal  A/P  Encounter Diagnosis  Name Primary?   Wrist tendonitis Yes    Wrist tendinitis left wrist no evidence of de Quervain's syndrome with no thumb tenderness  Recommend 10 mg of prednisone 3 times a day  Limit activities with the left wrist no heavy lifting  Wear a brace on the wrist  Follow-up in 4 weeks  Meds ordered this encounter  Medications   predniSONE (DELTASONE) 10 MG tablet    Sig: Take 1 tablet (10 mg total) by mouth 3 (three) times daily.    Dispense:  42 tablet    Refill:  1

## 2021-09-05 DIAGNOSIS — Z20822 Contact with and (suspected) exposure to covid-19: Secondary | ICD-10-CM | POA: Diagnosis not present

## 2021-09-05 DIAGNOSIS — J0100 Acute maxillary sinusitis, unspecified: Secondary | ICD-10-CM | POA: Diagnosis not present

## 2021-09-15 ENCOUNTER — Ambulatory Visit: Payer: Medicaid Other | Admitting: Orthopedic Surgery

## 2021-09-15 ENCOUNTER — Encounter: Payer: Self-pay | Admitting: Orthopedic Surgery

## 2022-04-30 ENCOUNTER — Encounter: Payer: Self-pay | Admitting: Radiology

## 2022-06-16 ENCOUNTER — Ambulatory Visit
Admission: EM | Admit: 2022-06-16 | Discharge: 2022-06-16 | Disposition: A | Discharge status: Home / Self Care | Payer: Medicaid Other | Attending: Nurse Practitioner | Admitting: Nurse Practitioner

## 2022-06-16 DIAGNOSIS — J019 Acute sinusitis, unspecified: Secondary | ICD-10-CM

## 2022-06-16 DIAGNOSIS — B9689 Other specified bacterial agents as the cause of diseases classified elsewhere: Secondary | ICD-10-CM

## 2022-06-16 MED ORDER — PROMETHAZINE-DM 6.25-15 MG/5ML PO SYRP
5.0000 mL | ORAL_SOLUTION | Freq: Every evening | ORAL | 0 refills | Status: AC | PRN
Start: 2022-06-16 — End: ?

## 2022-06-16 MED ORDER — BENZONATATE 100 MG PO CAPS: 100.0000 mg | ORAL_CAPSULE | Freq: Three times a day (TID) | ORAL | 0 refills | Status: AC | PRN

## 2022-06-16 MED ORDER — AMOXICILLIN-POT CLAVULANATE 875-125 MG PO TABS: 1.0000 | ORAL_TABLET | Freq: Two times a day (BID) | ORAL | 0 refills | Status: AC

## 2022-06-16 NOTE — Discharge Instructions (Signed)
You have a bacterial sinus infection.  Take the Augmentin twice daily as prescribed to treat it.    Some things that can make you feel better are: - Increased rest - Increasing fluid with water/sugar free electrolytes - Acetaminophen and ibuprofen as needed for fever/pain - Salt water gargling, chloraseptic spray and throat lozenges - OTC guaifenesin (Mucinex) 600 mg twice daily for congestion - Saline sinus flushes or a neti pot - Humidifying the air -Tessalon Perles during the day as needed for dry cough and cough syrup at nighttime as needed for dry cough - Flonase nasal spray to help with post nasal drainage

## 2022-06-16 NOTE — ED Triage Notes (Signed)
Cough that started 3-4 weeks ago, ear pain that started 1 week ago. Taking zyrtec, Claritin, mucinex with no relief of symptoms.

## 2022-06-16 NOTE — ED Provider Notes (Signed)
[facility]    CSN: [id number] Arrival date & time: 06/16/22  1548      History   Chief Complaint Chief Complaint  Patient presents with   Cough    HPI Tracy Rojas is a 47 y.o. female.   Patient presents today with 3 to 4-week history of cough that is at times congested, at times dry, chest pain after coughing, pain with inspiration in her upper chest, chest and nasal congestion, significant sinus pressure, runny nose, sore throat, headache, bilateral ear pain and fullness, and fatigue.  She denies fever, body aches or chills, shortness of breath, chest tightness, abdominal pain, nausea/vomiting, diarrhea, decreased appetite, and loss of taste or smell.  Has been taking over-the-counter Claritin, Mucinex cough and cold, and Zyrtec for symptoms without much benefit.  Patient denies chronic lung disease history.  Reports she has had bronchitis and sinus infections in the past.  Patient denies antibiotic use in the past 90 days.    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   GERD (gastroesophageal reflux disease)    Headache(784.0)    History of pre-eclampsia in prior pregnancy, currently pregnant    Impaired glucose tolerance in pregnancy 05/17/2015   Migraine without aura 12/13/2012   Normal pregnancy in multigravida in third trimester 10/01/2016   Normal pregnancy in multigravida in third trimester 10/01/2016   Normal vaginal delivery 05/17/2015   SVD (spontaneous vaginal delivery) 10/05/2016   Tension headache 12/13/2012   Thrombocytopenia 02/17/2012    Patient Active Problem List   Diagnosis Date Noted   Annual visit for general adult medical examination with abnormal findings 10/03/2019   Sciatica of right side 06/20/2019   Depression  05/02/2019   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 Authorizing Provider  acetaminophen (TYLENOL) 500 MG tablet Take 1,000 mg by mouth every 6 (six) hours as needed for mild pain, moderate pain, fever or headache.   Yes [provider]  amoxicillin-clavulanate (AUGMENTIN) 875-125 MG tablet Take 1 tablet by mouth 2 (two) times daily for 7 days. 06/16/22 06/23/22 Yes Valentino Nose, NP  benzonatate (TESSALON) 100 MG capsule Take 1 capsule (100 mg total) by mouth 3 (three) times daily as needed for cough. Do not take with alcohol or while driving or operating heavy machinery.  May cause drowsiness. 06/16/22  Yes Valentino Nose, NP  promethazine-dextromethorphan (PROMETHAZINE-DM) 6.25-15 MG/5ML syrup Take 5 mLs  by mouth at bedtime as needed for cough. Do not take with alcohol or while driving or operating heavy machinery.  May cause drowsiness. 06/16/22  Yes Valentino Nose, NP    Family History Family History  Problem Relation Age of Onset   Hypertension Mother    Migraines Mother        vascular migraines   Hypertension Father    Cancer Father        Throat    Social History Social History   Tobacco Use   Smoking status: Never   Smokeless tobacco: Never  Vaping Use   Vaping Use: Never used  Substance Use Topics   Alcohol use: No   Drug use: No     Allergies   Dimetapp c  [phenylephrine-bromphen-codeine], Ibuprofen, and Other   Review of Systems Review of Systems Per HPI  Physical Exam Triage Vital Signs ED Triage Vitals  Enc Vitals Group     BP 06/16/22 1604 124/82     Pulse Rate 06/16/22 1604 89     Resp 06/16/22 1604 18     Temp 06/16/22 1604 98.7 F (37.1 C)     Temp Source 06/16/22 1604 Oral     SpO2 06/16/22 1604 98 %     Weight --      Height --      Head Circumference --      Peak Flow --      Pain Score 06/16/22 1605 5     Pain Loc --      Pain Edu? --      Excl. in GC? --    No data found.  Updated Vital Signs BP 124/82 (BP Location: Right Arm)   Pulse 89   Temp 98.7 F (37.1 C) (Oral)   Resp 18   LMP 06/11/2022 (Approximate)   SpO2 98%   Visual Acuity Right Eye Distance:   Left Eye Distance:   Bilateral Distance:    Right Eye Near:   Left Eye Near:    Bilateral Near:     Physical Exam Vitals and nursing note reviewed.  Constitutional:      General: She is not in acute distress.    Appearance: Normal appearance. She is not ill-appearing or toxic-appearing.  HENT:     Head: Normocephalic and atraumatic.     Right Ear: Tympanic membrane, ear canal and external ear normal.     Left Ear: Tympanic membrane, ear canal and external ear normal.     Nose: Congestion and rhinorrhea present.     Right Sinus: Maxillary sinus tenderness and frontal sinus tenderness present.     Left Sinus: Maxillary sinus tenderness and frontal sinus tenderness present.     Mouth/Throat:     Mouth: Mucous membranes are moist.     Pharynx: Oropharynx is clear. Posterior oropharyngeal erythema present. No oropharyngeal exudate.  Eyes:     General: No scleral icterus.    Extraocular Movements: Extraocular movements intact.  Cardiovascular:     Rate and Rhythm: Normal rate and regular rhythm.  Pulmonary:     Effort: Pulmonary effort is normal. No respiratory distress.     Breath sounds: Normal breath sounds. No wheezing, rhonchi or rales.   Abdominal:     General: Abdomen is flat. Bowel sounds are normal. There is no distension.     Palpations: Abdomen is soft.  Musculoskeletal:     Cervical back: Normal range of motion and neck supple.  Lymphadenopathy:     Cervical:  No cervical adenopathy.  Skin:    General: Skin is warm and dry.     Capillary Refill: Capillary refill takes less than 2 seconds.     Coloration: Skin is not jaundiced or pale.     Findings: No erythema or rash.  Neurological:     Mental Status: She is alert and oriented to person, place, and time.  Psychiatric:        Behavior: Behavior is cooperative.      UC Treatments / Results  Labs (all labs ordered are listed, but only abnormal results are displayed) Labs Reviewed - No data to display  EKG   Radiology No results found.  Procedures Procedures (including critical care time)  Medications Ordered in UC Medications - No data to display  Initial Impression / Assessment and Plan / UC Course  I have reviewed the triage vital signs and the nursing notes.  Pertinent labs & imaging results that were available during my care of the patient were reviewed by me and considered in my medical decision making (see chart for details).   Patient is well-appearing, normotensive, afebrile, not tachycardic, not tachypneic, oxygenating well on room air.    1. Acute bacterial sinusitis Given length of symptoms, sinus pressure, suspect bacterial sinusitis Treat with Augmentin twice daily for 7 days Supportive care discussed with patient Start cough suppressant medication ER and return precautions discussed Note given for work  The patient was given the opportunity to ask questions.  All questions answered to their satisfaction.  The patient is in agreement to this plan.    Final Clinical Impressions(s) / UC Diagnoses   Final diagnoses:  Acute bacterial sinusitis     Discharge Instructions      You have a bacterial sinus infection.  Take the  Augmentin twice daily as prescribed to treat it.    Some things that can make you feel better are: - Increased rest - Increasing fluid with water/sugar free electrolytes - Acetaminophen and ibuprofen as needed for fever/pain - Salt water gargling, chloraseptic spray and throat lozenges - OTC guaifenesin (Mucinex) 600 mg twice daily for congestion - Saline sinus flushes or a neti pot - Humidifying the air -Tessalon Perles during the day as needed for dry cough and cough syrup at nighttime as needed for dry cough - Flonase nasal spray to help with post nasal drainage      ED Prescriptions     Medication Sig Dispense Auth. Provider   amoxicillin-clavulanate (AUGMENTIN) 875-125 MG tablet Take 1 tablet by mouth 2 (two) times daily for 7 days. 14 tablet Cathlean Marseilles A, NP   benzonatate (TESSALON) 100 MG capsule Take 1 capsule (100 mg total) by mouth 3 (three) times daily as needed for cough. Do not take with alcohol or while driving or operating heavy machinery.  May cause drowsiness. 21 capsule Cathlean Marseilles A, NP   promethazine-dextromethorphan (PROMETHAZINE-DM) 6.25-15 MG/5ML syrup Take 5 mLs by mouth at bedtime as needed for cough. Do not take with alcohol or while driving or operating heavy machinery.  May cause drowsiness. 118 mL Valentino Nose, NP      PDMP not reviewed this encounter.   Valentino Nose, NP 06/16/22 1630

## 2022-06-22 DIAGNOSIS — J40 Bronchitis, not specified as acute or chronic: Secondary | ICD-10-CM | POA: Diagnosis not present

## 2022-06-22 DIAGNOSIS — Z6841 Body Mass Index (BMI) 40.0 and over, adult: Secondary | ICD-10-CM | POA: Diagnosis not present

## 2023-03-21 ENCOUNTER — Encounter (HOSPITAL_COMMUNITY): Payer: Self-pay

## 2023-03-21 ENCOUNTER — Other Ambulatory Visit: Payer: Self-pay

## 2023-03-21 ENCOUNTER — Emergency Department (HOSPITAL_COMMUNITY)
Admission: EM | Admit: 2023-03-21 | Discharge: 2023-03-21 | Disposition: A | Discharge status: Home / Self Care | Payer: Medicaid Other | Attending: Emergency Medicine | Admitting: Emergency Medicine

## 2023-03-21 DIAGNOSIS — G8929 Other chronic pain: Secondary | ICD-10-CM | POA: Insufficient documentation

## 2023-03-21 DIAGNOSIS — M25562 Pain in left knee: Secondary | ICD-10-CM | POA: Diagnosis not present

## 2023-03-21 MED ORDER — OXYCODONE-ACETAMINOPHEN 5-325 MG PO TABS: 1.0000 | ORAL_TABLET | Freq: Four times a day (QID) | ORAL | 0 refills | Status: AC | PRN

## 2023-03-21 MED ORDER — OXYCODONE-ACETAMINOPHEN 5-325 MG PO TABS
2.0000 | ORAL_TABLET | Freq: Once | ORAL | Status: AC
  Administered 2023-03-21: 2 via ORAL
  Filled 2023-03-21: qty 2

## 2023-03-21 MED ORDER — DICLOFENAC SODIUM 1 % EX GEL: 2.0000 g | Freq: Four times a day (QID) | CUTANEOUS | 1 refills | Status: AC
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
## 2023-03-21 NOTE — ED Provider Notes (Signed)
 [place] EMERGENCY DEPARTMENT AT [facility] Provider Note   CSN: [id number] Arrival date & time: 03/21/23  1912     History Chief Complaint  Patient presents with   Knee Pain    Tracy Rojas is a 48 y.o. female.  Patient with past history of knee surgery presents to the emergency department concerns of knee pain.  This is chronic in appearance and does not appear to be any acute or new injury.  No recent falls or other trauma.  Patient states that she is having acutely inflamed pain to the left knee that typically occurs about once per year.  She typically sees Dr. Romeo Apple with orthopedics for a joint injection but has been unable to see him.  Has tried taking Tylenol at home without improvement in symptoms.  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   Knee Pain      Home Medications Prior to Admission medications   Medication Sig Start Date End Date Taking? Authorizing Provider  diclofenac Sodium (VOLTAREN) 1 % GEL Apply 2 g topically 4 (four) times daily. 03/21/23  Yes Smitty Knudsen, PA-C  oxyCODONE-acetaminophen (PERCOCET/ROXICET) 5-325 MG tablet Take 1 tablet by mouth every 6 (six) hours as needed for severe pain (pain score 7-10). 03/21/23  Yes Smitty Knudsen, PA-C  acetaminophen (TYLENOL) 500 MG tablet Take 1,000 mg by mouth every 6 (six) hours as needed for mild pain, moderate pain, fever or headache.    [provider]  benzonatate (TESSALON) 100 MG capsule Take 1 capsule (100 mg total) by mouth 3 (three) times daily as needed for cough. Do not take with alcohol or while driving or operating heavy machinery.  May cause drowsiness. 06/16/22   Valentino Nose, NP  promethazine-dextromethorphan (PROMETHAZINE-DM) 6.25-15 MG/5ML syrup Take 5 mLs by mouth at bedtime as needed for cough. Do not take with alcohol or while driving or operating heavy machinery.  May cause drowsiness. 06/16/22   Valentino Nose, NP       Allergies    Dimetapp c [phenylephrine-bromphen-codeine], Ibuprofen, and Other    Review of Systems   Review of Systems  Musculoskeletal:        Knee pain  All other systems reviewed and are negative.   Physical Exam Updated Vital Signs BP 133/84   Pulse (!) 105   Temp 98.5 F (36.9 C) (Oral)   Ht 5\' 2"  (1.575 m)   Wt 99.8 kg   SpO2 99%   BMI 40.24 kg/m  Physical Exam Vitals and nursing note reviewed.  Constitutional:      General: She is not in acute distress.    Appearance: She is well-developed.  HENT:     Head: Normocephalic and atraumatic.  Eyes:     Conjunctiva/sclera: Conjunctivae normal.  Cardiovascular:     Rate and Rhythm: Normal rate and regular rhythm.     Heart sounds: No murmur heard. Pulmonary:     Effort: Pulmonary effort is normal. No respiratory distress.     Breath sounds: Normal breath sounds.  Abdominal:     Palpations: Abdomen is soft.     Tenderness: There is no abdominal tenderness.  Musculoskeletal:        General: Tenderness present. No swelling, deformity or signs of injury. Normal range of motion.     Cervical back: Neck supple.     Comments: No swelling, erythema, or edema to the left knee.  Range of motion is intact  although somewhat limited due to pain.  Skin:    General: Skin is warm and dry.     Capillary Refill: Capillary refill takes less than 2 seconds.  Neurological:     Mental Status: She is alert.  Psychiatric:        Mood and Affect: Mood normal.     ED Results / Procedures / Treatments   Labs (all labs ordered are listed, but only abnormal results are displayed) Labs Reviewed - No data to display  EKG None  Radiology No results found.  Procedures Procedures    Medications Ordered in ED Medications  oxyCODONE-acetaminophen (PERCOCET/ROXICET) 5-325 MG per tablet 2 tablet (has no administration in time range)    ED Course/ Medical Decision Making/ A&P                                 Medical Decision  Making Risk Prescription drug management.   This patient presents to the ED for concern of knee pain.  Differential diagnosis includes chronic knee pain, osteoarthritis, joint effusion, septic arthritis   Medicines ordered and prescription drug management:  I ordered medication including Percocet for pain Reevaluation of the patient after these medicines showed that the patient improved I have reviewed the patients home medicines and have made adjustments as needed   Problem List / ED Course:  Patient presents to the emergency department concerns of left knee pain.  States that this is a chronic issue after prior injury to the knee requiring orthopedic repair.  She sees Dr. Romeo Apple for joint injections but has been unable to see him.  She states that she has tried managing symptoms with Tylenol at home without improvement.  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  She is requesting some further help with pain control. Exam is unremarkable with knee not appearing to be septic as there is no erythema, edema, or warmth.  Patient has preserved range of motion and although somewhat limited due to pain.  Will give patient a dose of Percocet here in the emergency department with a short supply sent to patient's pharmacy.  Also will prescribe Voltaren topical gel for application to the area as this is not absorbed systemically.  Advised patient to follow-up with PCP for further evaluation as well as orthopedics for joint injection.  Discussed return precautions to the emergency department such as evidence of an infected knee joint.  No other acute concerns at this time and patient discharged home in stable condition.    Final Clinical Impression(s) / ED Diagnoses Final diagnoses:  Chronic pain of left knee    Rx / DC Orders ED Discharge Orders          Ordered    oxyCODONE-acetaminophen (PERCOCET/ROXICET) 5-325 MG tablet  Every 6 hours PRN        03/21/23 2031     diclofenac Sodium (VOLTAREN) 1 % GEL  4 times daily        03/21/23 2031              Salomon Mast 03/21/23 2034    Gloris Manchester, MD 03/22/23 [phone]

## 2023-03-21 NOTE — Discharge Instructions (Signed)
You are seen in the emergency department today with concerns of knee pain.  Unfortunately unable to provide a knee injection however I have given you a dose of Percocet with a small amounts of your pharmacy.  Have also sent a prescription for Voltaren topical gel as you unable to take any oral anti-inflammatory medications.  This should not affect your platelet aggregation.  If you have any worsening symptoms, please follow-up with your orthopedic surgeon for a joint injection.

## 2023-03-29 ENCOUNTER — Ambulatory Visit: Payer: Medicaid Other | Admitting: Orthopedic Surgery

## 2023-03-29 ENCOUNTER — Other Ambulatory Visit (INDEPENDENT_AMBULATORY_CARE_PROVIDER_SITE_OTHER): Payer: Medicaid Other

## 2023-03-29 ENCOUNTER — Encounter: Payer: Self-pay | Admitting: Orthopedic Surgery

## 2023-03-29 VITALS — BP 127/89 | HR 88 | Ht 63.0 in | Wt 226.0 lb

## 2023-03-29 DIAGNOSIS — G8929 Other chronic pain: Secondary | ICD-10-CM

## 2023-03-29 DIAGNOSIS — M25562 Pain in left knee: Secondary | ICD-10-CM

## 2023-03-29 NOTE — Progress Notes (Signed)
   BP 127/89 (Cuff Size: Large)   Pulse 88   Ht 5\' 3"  (1.6 m)   Wt 226 lb (102.5 kg)   BMI 40.03 kg/m   Body mass index is 40.03 kg/m.  No chief complaint on file.   No diagnosis found.  DOI/DOS/ Date: 03/21/23  Improved

## 2023-03-29 NOTE — Progress Notes (Signed)
  Subjective:     Patient ID: Tracy Rojas, female   DOB: 02/07/1976, 48 y.o.   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

## 2023-04-01 ENCOUNTER — Ambulatory Visit: Payer: Medicaid Other | Admitting: Orthopedic Surgery

## 2023-04-07 ENCOUNTER — Other Ambulatory Visit: Payer: Self-pay | Admitting: Medical Genetics

## 2023-04-08 ENCOUNTER — Other Ambulatory Visit (HOSPITAL_COMMUNITY)
Admission: RE | Admit: 2023-04-08 | Discharge: 2023-04-08 | Disposition: A | Discharge status: Home / Self Care | Payer: Self-pay | Source: Ambulatory Visit | Attending: Oncology | Admitting: Oncology

## 2023-04-18 LAB — GENECONNECT MOLECULAR SCREEN: Genetic Analysis Overall Interpretation: NEGATIVE

## 2023-10-21 IMAGING — DX DG WRIST COMPLETE 3+V*L*
4 series · 4 of 4 positions shown · non-contrast
Comparison: None Available.

CLINICAL DATA: Left hand and wrist pain.

EXAM:
LEFT WRIST - COMPLETE 3+ VIEW

[wrist pa]
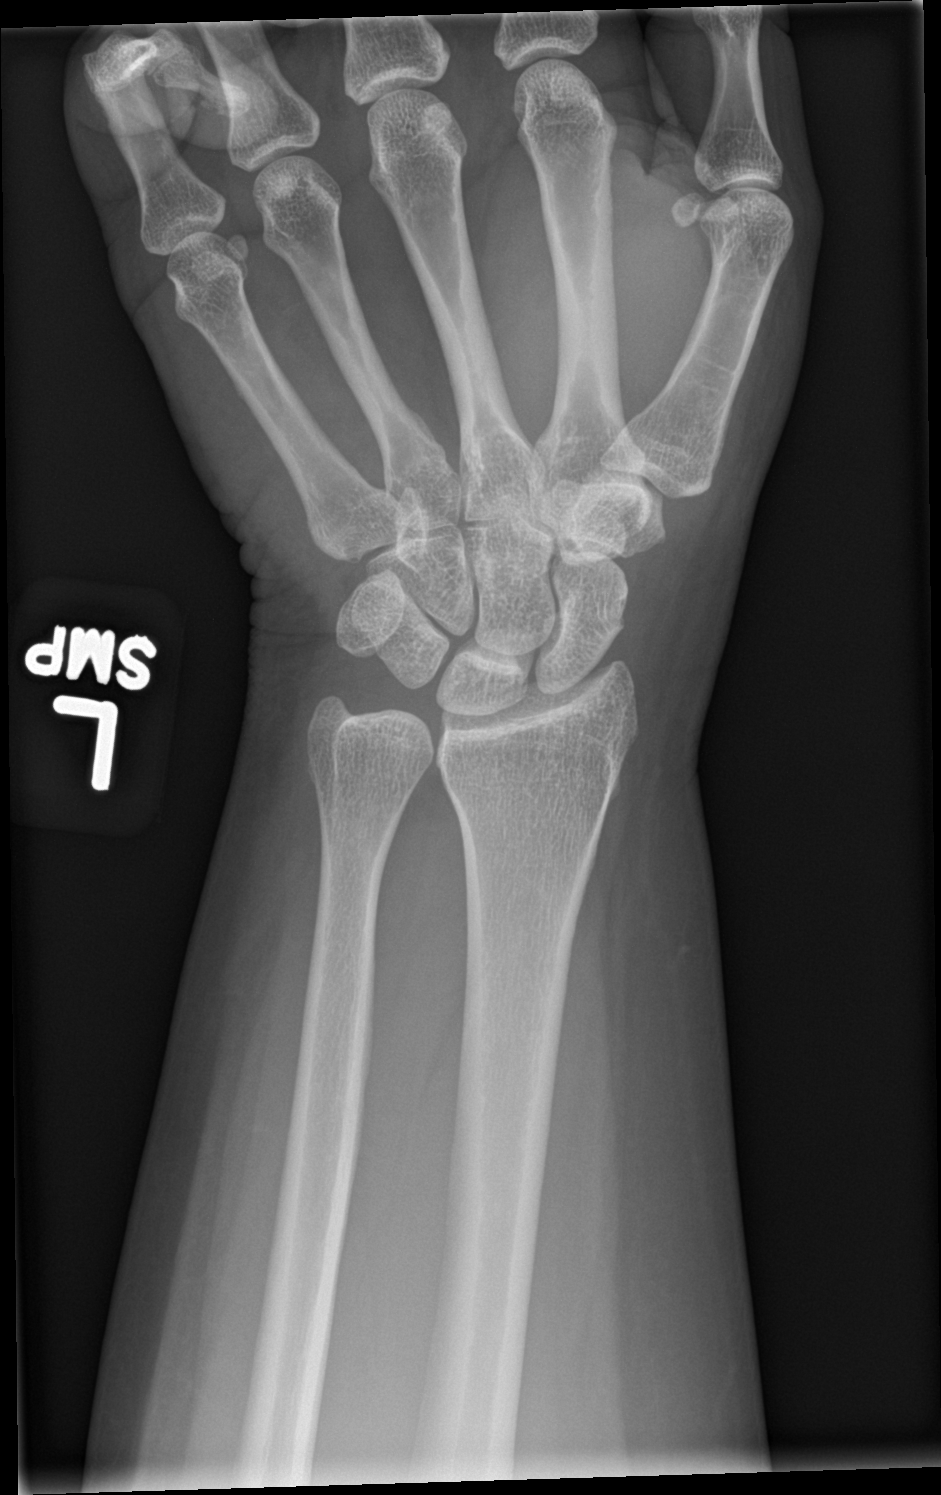

[wrist obl]
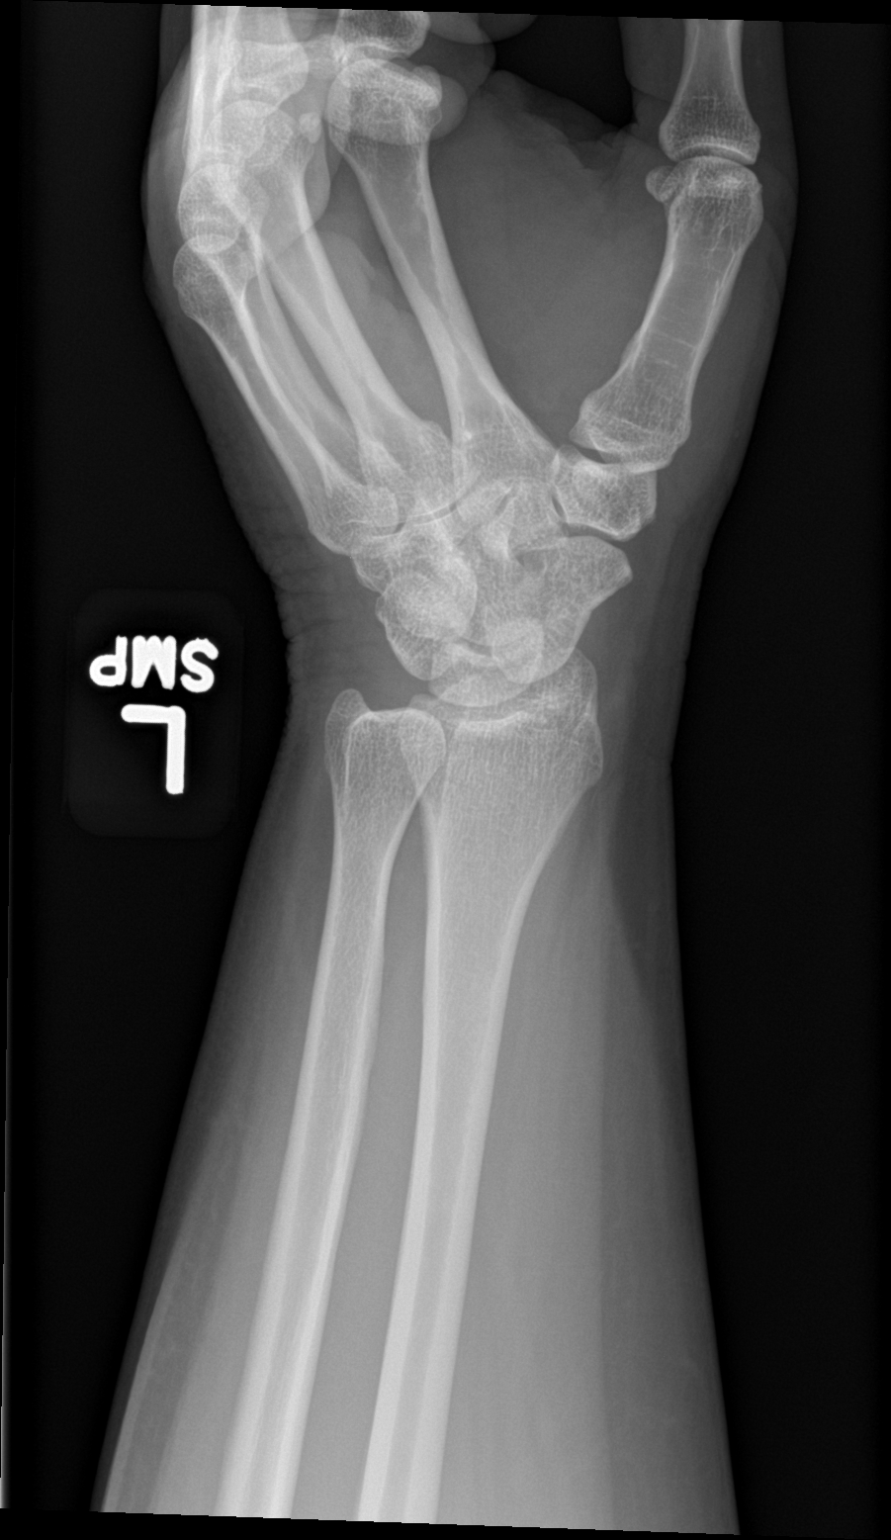

[wrist lat]
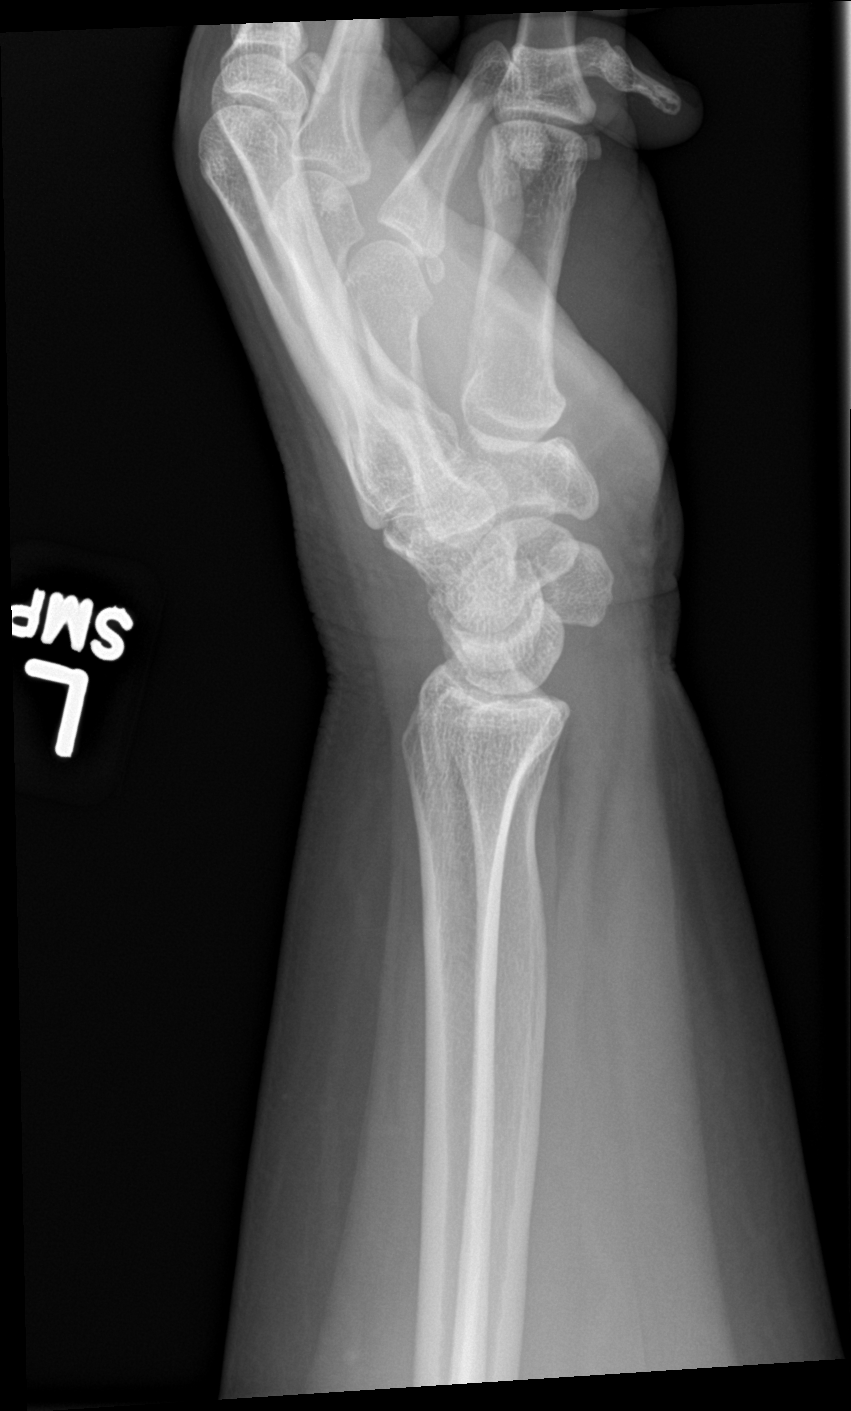

[wrist navicular]
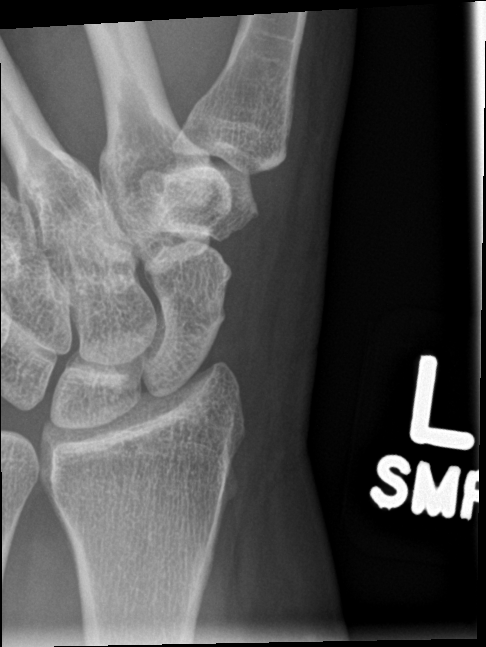

[4 of 4 positions shown; findings below may reference images not displayed]

FINDINGS: There is no evidence of fracture or dislocation. There is no
evidence of arthropathy or other focal bone abnormality. Soft
tissues are unremarkable.
IMPRESSION: Negative.

## 2023-12-24 ENCOUNTER — Other Ambulatory Visit (HOSPITAL_BASED_OUTPATIENT_CLINIC_OR_DEPARTMENT_OTHER): Payer: Self-pay

## 2023-12-24 MED ORDER — FLUZONE 0.5 ML IM SUSY
0.5000 mL | PREFILLED_SYRINGE | Freq: Once | INTRAMUSCULAR | 0 refills | Status: AC
  Filled 2023-12-24: qty 0.5, 1d supply, fill #0

## 2023-12-27 ENCOUNTER — Ambulatory Visit: Admitting: Physician Assistant

## 2024-05-04 ENCOUNTER — Ambulatory Visit: Admitting: Physician Assistant
